# Patient Record
Sex: Male | Born: 1945 | Race: White | Hispanic: No | Marital: Married | Smoking: Never smoker
Health system: Southern US, Community
[De-identification: ages and names within clinical notes are randomized; demographics above are authoritative.]

## PROBLEM LIST (undated history)

## (undated) DIAGNOSIS — L570 Actinic keratosis: Secondary | ICD-10-CM

## (undated) DIAGNOSIS — N400 Enlarged prostate without lower urinary tract symptoms: Secondary | ICD-10-CM

## (undated) DIAGNOSIS — G4730 Sleep apnea, unspecified: Secondary | ICD-10-CM

## (undated) DIAGNOSIS — E785 Hyperlipidemia, unspecified: Secondary | ICD-10-CM

## (undated) DIAGNOSIS — M722 Plantar fascial fibromatosis: Secondary | ICD-10-CM

## (undated) DIAGNOSIS — I10 Essential (primary) hypertension: Secondary | ICD-10-CM

## (undated) DIAGNOSIS — M5459 Other low back pain: Secondary | ICD-10-CM

## (undated) DIAGNOSIS — J309 Allergic rhinitis, unspecified: Secondary | ICD-10-CM

## (undated) DIAGNOSIS — M545 Low back pain: Secondary | ICD-10-CM

## (undated) HISTORY — PX: COLONOSCOPY: SHX174

## (undated) HISTORY — PX: HERNIA REPAIR: SHX51

## (undated) HISTORY — DX: Actinic keratosis: L57.0

---

## 2005-07-31 ENCOUNTER — Ambulatory Visit: Payer: Self-pay | Admitting: Surgery

## 2005-11-10 ENCOUNTER — Ambulatory Visit: Payer: Self-pay | Admitting: Gastroenterology

## 2010-02-01 ENCOUNTER — Ambulatory Visit: Payer: Self-pay | Admitting: Unknown Physician Specialty

## 2011-07-10 ENCOUNTER — Ambulatory Visit: Payer: Self-pay | Admitting: Otolaryngology

## 2011-07-24 ENCOUNTER — Ambulatory Visit: Payer: Self-pay | Admitting: Otolaryngology

## 2015-03-07 ENCOUNTER — Encounter: Payer: Self-pay | Admitting: *Deleted

## 2015-03-08 ENCOUNTER — Ambulatory Visit: Payer: Medicare Other | Admitting: Certified Registered Nurse Anesthetist

## 2015-03-08 ENCOUNTER — Encounter
Admission: RE | Disposition: A | Discharge status: Home / Self Care | Payer: Self-pay | Source: Ambulatory Visit | Attending: Unknown Physician Specialty

## 2015-03-08 ENCOUNTER — Ambulatory Visit
Admission: RE | Admit: 2015-03-08 | Discharge: 2015-03-08 | Disposition: A | Discharge status: Home / Self Care | Payer: Medicare Other | Source: Ambulatory Visit | Attending: Unknown Physician Specialty | Admitting: Unknown Physician Specialty

## 2015-03-08 ENCOUNTER — Encounter: Payer: Self-pay | Admitting: *Deleted

## 2015-03-08 DIAGNOSIS — E785 Hyperlipidemia, unspecified: Secondary | ICD-10-CM | POA: Diagnosis not present

## 2015-03-08 DIAGNOSIS — Z1211 Encounter for screening for malignant neoplasm of colon: Secondary | ICD-10-CM | POA: Insufficient documentation

## 2015-03-08 DIAGNOSIS — Z86010 Personal history of colonic polyps: Secondary | ICD-10-CM | POA: Insufficient documentation

## 2015-03-08 DIAGNOSIS — I10 Essential (primary) hypertension: Secondary | ICD-10-CM | POA: Insufficient documentation

## 2015-03-08 DIAGNOSIS — Z79899 Other long term (current) drug therapy: Secondary | ICD-10-CM | POA: Insufficient documentation

## 2015-03-08 DIAGNOSIS — G4733 Obstructive sleep apnea (adult) (pediatric): Secondary | ICD-10-CM | POA: Insufficient documentation

## 2015-03-08 DIAGNOSIS — K640 First degree hemorrhoids: Secondary | ICD-10-CM | POA: Diagnosis not present

## 2015-03-08 DIAGNOSIS — N400 Enlarged prostate without lower urinary tract symptoms: Secondary | ICD-10-CM | POA: Insufficient documentation

## 2015-03-08 DIAGNOSIS — K5730 Diverticulosis of large intestine without perforation or abscess without bleeding: Secondary | ICD-10-CM | POA: Diagnosis not present

## 2015-03-08 DIAGNOSIS — Z7982 Long term (current) use of aspirin: Secondary | ICD-10-CM | POA: Diagnosis not present

## 2015-03-08 HISTORY — DX: Other low back pain: M54.59

## 2015-03-08 HISTORY — DX: Essential (primary) hypertension: I10

## 2015-03-08 HISTORY — DX: Low back pain: M54.5

## 2015-03-08 HISTORY — DX: Benign prostatic hyperplasia without lower urinary tract symptoms: N40.0

## 2015-03-08 HISTORY — DX: Plantar fascial fibromatosis: M72.2

## 2015-03-08 HISTORY — DX: Hyperlipidemia, unspecified: E78.5

## 2015-03-08 HISTORY — PX: COLONOSCOPY WITH PROPOFOL: SHX5780

## 2015-03-08 HISTORY — DX: Sleep apnea, unspecified: G47.30

## 2015-03-08 HISTORY — DX: Allergic rhinitis, unspecified: J30.9

## 2015-03-08 SURGERY — COLONOSCOPY WITH PROPOFOL
Anesthesia: General

## 2015-03-08 MED ORDER — PROPOFOL 10 MG/ML IV BOLUS
INTRAVENOUS | Status: DC | PRN
  Administered 2015-03-08: 20 mg via INTRAVENOUS

## 2015-03-08 MED ORDER — EPHEDRINE SULFATE 50 MG/ML IJ SOLN
INTRAMUSCULAR | Status: DC | PRN
  Administered 2015-03-08: 5 mg via INTRAVENOUS

## 2015-03-08 MED ORDER — SODIUM CHLORIDE 0.9 % IV SOLN
INTRAVENOUS | Status: DC
  Administered 2015-03-08: 10:00:00 via INTRAVENOUS

## 2015-03-08 MED ORDER — SODIUM CHLORIDE 0.9 % IV SOLN: INTRAVENOUS | Status: DC

## 2015-03-08 MED ORDER — LIDOCAINE HCL (CARDIAC) 20 MG/ML IV SOLN
INTRAVENOUS | Status: DC | PRN
  Administered 2015-03-08: 60 mg via INTRAVENOUS

## 2015-03-08 MED ORDER — MIDAZOLAM HCL 2 MG/2ML IJ SOLN
INTRAMUSCULAR | Status: DC | PRN
  Administered 2015-03-08: 1 mg via INTRAVENOUS

## 2015-03-08 MED ORDER — PROPOFOL 500 MG/50ML IV EMUL
INTRAVENOUS | Status: DC | PRN
  Administered 2015-03-08: 120 ug/kg/min via INTRAVENOUS

## 2015-03-08 NOTE — Anesthesia Preprocedure Evaluation (Signed)
Anesthesia Evaluation  Patient identified by MRN, date of birth, ID band Patient awake    Reviewed: Allergy & Precautions, NPO status , Patient's Chart, lab work & pertinent test results  History of Anesthesia Complications Negative for: history of anesthetic complications  Airway Mallampati: III       Dental   Pulmonary neg pulmonary ROS, sleep apnea and Continuous Positive Airway Pressure Ventilation ,           Cardiovascular hypertension, Pt. on medications      Neuro/Psych    GI/Hepatic negative GI ROS, Neg liver ROS, GERD  ,  Endo/Other  negative endocrine ROS  Renal/GU negative Renal ROS     Musculoskeletal   Abdominal   Peds  Hematology negative hematology ROS (+)   Anesthesia Other Findings   Reproductive/Obstetrics                             Anesthesia Physical Anesthesia Plan  ASA: III  Anesthesia Plan: General   Post-op Pain Management:    Induction: Intravenous  Airway Management Planned: Nasal Cannula  Additional Equipment:   Intra-op Plan:   Post-operative Plan:   Informed Consent: I have reviewed the patients History and Physical, chart, labs and discussed the procedure including the risks, benefits and alternatives for the proposed anesthesia with the patient or authorized representative who has indicated his/her understanding and acceptance.     Plan Discussed with:   Anesthesia Plan Comments:         Anesthesia Quick Evaluation

## 2015-03-08 NOTE — Anesthesia Procedure Notes (Signed)
Date/Time: 03/08/2015 10:03 AM Performed by: Johnna Acosta Pre-anesthesia Checklist: Patient identified, Emergency Drugs available, Suction available, Patient being monitored and Timeout performed Patient Re-evaluated:Patient Re-evaluated prior to inductionOxygen Delivery Method: Nasal cannula

## 2015-03-08 NOTE — Anesthesia Postprocedure Evaluation (Signed)
Anesthesia Post Note  Patient: Justin Torres  Procedure(s) Performed: Procedure(s) (LRB): COLONOSCOPY WITH PROPOFOL (N/A)  Patient location during evaluation: Endoscopy Anesthesia Type: General Level of consciousness: awake and alert Pain management: pain level controlled Vital Signs Assessment: post-procedure vital signs reviewed and stable Respiratory status: spontaneous breathing and respiratory function stable Cardiovascular status: stable Anesthetic complications: no    Last Vitals:  Filed Vitals:   03/08/15 1053 03/08/15 1103  BP: 108/68 113/73  Pulse: 55 51  Temp:    Resp: 16 14    Last Pain: There were no vitals filed for this visit.               Hesper Venturella K

## 2015-03-08 NOTE — Op Note (Signed)
[facility] Gastroenterology Patient Name: Justin Torres Procedure Date: 03/08/2015 10:00 AM MRN: [id number] Account #: [id number] Date of Birth: 19-Jun-1945 Admit Type: Outpatient Age: 70 Room: [facility] ENDO ROOM 1 Gender: Male Note Status: Finalized Procedure:         Colonoscopy Indications:       High risk colon cancer surveillance: Personal history of                     colonic polyps Providers:         Manya Silvas, MD Referring MD:      Leonie Douglas. Doy Hutching, MD (Referring MD) Medicines:         Propofol per Anesthesia Complications:     No immediate complications. Procedure:         Pre-Anesthesia Assessment:                    - After reviewing the risks and benefits, the patient was                     deemed in satisfactory condition to undergo the procedure.                    After obtaining informed consent, the colonoscope was                     passed under direct vision. Throughout the procedure, the                     patient's blood pressure, pulse, and oxygen saturations                     were monitored continuously. The Colonoscope was                     introduced through the anus and advanced to the the cecum,                     identified by appendiceal orifice and ileocecal valve. The                     colonoscopy was performed without difficulty. The patient                     tolerated the procedure well. The quality of the bowel                     preparation was good. Findings:      Multiple small-mouthed diverticula were found in the sigmoid colon, in       the descending colon and in the transverse colon.      Internal hemorrhoids were found during endoscopy. The hemorrhoids were       small and Grade I (internal hemorrhoids that do not prolapse).      The exam was otherwise without abnormality. Impression:        - Diverticulosis in the sigmoid colon, in the descending                     colon and in the transverse  colon.                    - Internal hemorrhoids.                    -  The examination was otherwise normal.                    - No specimens collected. Recommendation:    - Repeat colonoscopy in 5 years for surveillance. Diagnosis Code(s): --- Professional ---                    Z86.010, Personal history of colonic polyps                    K64.0, First degree hemorrhoids                    K57.30, Diverticulosis of large intestine without                     perforation or abscess without bleeding Manya Silvas, MD 03/08/2015 10:22:42 AM This report has been signed electronically. Number of Addenda: 0 Note Initiated On: 03/08/2015 10:00 AM Scope Withdrawal Time: 0 hours 6 minutes 45 seconds  Total Procedure Duration: 0 hours 14 minutes 22 seconds       [facility]

## 2015-03-08 NOTE — H&P (Signed)
   Primary Care Physician:  Idelle Crouch, MD Primary Gastroenterologist:  Dr. Vira Agar  Pre-Procedure History & Physical: HPI:  Justin Torres is a 70 y.o. male is here for an colonoscopy.   Past Medical History  Diagnosis Date  . Allergic rhinitis   . Benign hypertension   . BPH (benign prostatic hypertrophy)   . Hyperlipidemia   . Mechanical low back pain   . Sleep apnea     Obstructive; Uses CPAP  . Plantar fasciitis     h/o heel spur    Past Surgical History  Procedure Laterality Date  . Hernia repair      left inguinal  . Colonoscopy  11/10/2005; 02/01/2010    With adenomatous polyp    Prior to Admission medications   Medication Sig Start Date End Date Taking? Authorizing Provider  aspirin EC 81 MG tablet Take 81 mg by mouth daily.   Yes Historical Provider, MD  doxazosin (CARDURA) 2 MG tablet Take 2 mg by mouth daily.   Yes Historical Provider, MD  primidone (MYSOLINE) 50 MG tablet Take 50 mg by mouth 2 (two) times daily.   Yes Historical Provider, MD    Allergies as of 02/06/2015  . (Not on File)    History reviewed. No pertinent family history.  Social History   Social History  . Marital Status: Married    Spouse Name: N/A  . Number of Children: N/A  . Years of Education: N/A   Occupational History  . Not on file.   Social History Main Topics  . Smoking status: Never Smoker   . Smokeless tobacco: Never Used  . Alcohol Use: No  . Drug Use: No  . Sexual Activity: Not on file   Other Topics Concern  . Not on file   Social History Narrative    Review of Systems: See HPI, otherwise negative ROS  Physical Exam: BP 120/56 mmHg  Pulse 51  Temp(Src) 97.2 F (36.2 C) (Tympanic)  Resp 14  Ht 5\' 10"  (1.778 m)  Wt 88.451 kg (195 lb)  BMI 27.98 kg/m2  SpO2 97% General:   Alert,  pleasant and cooperative in NAD Head:  Normocephalic and atraumatic. Neck:  Supple; no masses or thyromegaly. Lungs:  Clear throughout to auscultation.    Heart:   Regular rate and rhythm. Abdomen:  Soft, nontender and nondistended. Normal bowel sounds, without guarding, and without rebound.   Neurologic:  Alert and  oriented x4;  grossly normal neurologically.  Impression/Plan: Felipa Furnace is here for an colonoscopy to be performed for [facility] colon polyps  Risks, benefits, limitations, and alternatives regarding  colonoscopy have been reviewed with the patient.  Questions have been answered.  All parties agreeable.   Gaylyn Cheers, MD  03/08/2015, 9:55 AM

## 2015-03-08 NOTE — Transfer of Care (Signed)
Immediate Anesthesia Transfer of Care Note  Patient: Justin Torres  Procedure(s) Performed: Procedure(s): COLONOSCOPY WITH PROPOFOL (N/A)  Patient Location: PACU  Anesthesia Type:General  Level of Consciousness: sedated  Airway & Oxygen Therapy: Patient Spontanous Breathing and Patient connected to nasal cannula oxygen  Post-op Assessment: Report given to RN and Post -op Vital signs reviewed and stable  Post vital signs: Reviewed and stable  Last Vitals:  Filed Vitals:   03/08/15 0922 03/08/15 1023  BP: 120/56 94/51  Pulse: 51 51  Temp: 36.2 C 36 C  Resp: 14 12    Complications: No apparent anesthesia complications

## 2015-03-12 ENCOUNTER — Encounter: Payer: Self-pay | Admitting: Unknown Physician Specialty

## 2016-02-25 ENCOUNTER — Ambulatory Visit (INDEPENDENT_AMBULATORY_CARE_PROVIDER_SITE_OTHER): Payer: Medicare Other | Admitting: Urology

## 2016-02-25 ENCOUNTER — Encounter: Payer: Self-pay | Admitting: Urology

## 2016-02-25 VITALS — BP 157/77 | HR 56 | Ht 70.0 in | Wt 194.1 lb

## 2016-02-25 DIAGNOSIS — R972 Elevated prostate specific antigen [PSA]: Secondary | ICD-10-CM

## 2016-02-25 DIAGNOSIS — R3912 Poor urinary stream: Secondary | ICD-10-CM

## 2016-02-25 MED ORDER — SULFAMETHOXAZOLE-TRIMETHOPRIM 400-80 MG PO TABS: 1.0000 | ORAL_TABLET | Freq: Two times a day (BID) | ORAL | 0 refills | Status: DC

## 2016-02-25 NOTE — Progress Notes (Signed)
02/25/2016 9:38 AM   Justin Torres Peter Garter [birth year] [id number]  Referring provider: Idelle Crouch, MD [facility] [place], [place] [address]  CC: new patient with elevated / rising PSA  HPI: 1. Elevated / Rising PSA - No FHX prostate cancer.  2016 - PSA 3.3 12/2015 PSA 4.44 ===> recheck 01/2016 4.48 / DRE >80gm smooth.   2. Weak urinary stream - on doxazosin at baseline for dual indication of obstructive urinary symptoms and HTN with excellent control. DRE 2018 >80gm.   PMH sig for OSA/CPAP, LIH, HTN. NO ischemic CV disease / blood thinners. 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 Marland Kitchen His PCP is Georgie Chard MD with [facility].    PMH: Past Medical History:  Diagnosis Date  . Allergic rhinitis   . Benign hypertension   . BPH (benign prostatic hypertrophy)   . Hyperlipidemia   . Mechanical low back pain   . Plantar fasciitis    h/o heel spur  . Sleep apnea    Obstructive; Uses CPAP    Surgical History: Past Surgical History:  Procedure Laterality Date  . COLONOSCOPY  11/10/2005; 02/01/2010   With adenomatous polyp  . COLONOSCOPY WITH PROPOFOL N/A 03/08/2015   Procedure: COLONOSCOPY WITH PROPOFOL;  Surgeon: Manya Silvas, MD;  Location: [facility] ENDOSCOPY;  Service: Endoscopy;  Laterality: N/A;  . HERNIA REPAIR     left inguinal    Home Medications:  Allergies as of 02/25/2016      Reactions   Darvon [propoxyphene] Nausea And Vomiting      Medication List       Accurate as of 02/25/16  9:38 AM. Always use your most recent med list.          aspirin EC 81 MG tablet Take 81 mg by mouth daily.   doxazosin 2 MG tablet Commonly known as:  CARDURA Take 2 mg by mouth daily.   primidone 50 MG tablet Commonly known as:  MYSOLINE Take 50 mg by mouth 2 (two) times daily.       Allergies:  Allergies  Allergen Reactions  . Darvon [Propoxyphene] Nausea And Vomiting    Family History: No family history on  file.  Social History:  reports that he has never smoked. He has never used smokeless tobacco. He reports that he does not drink alcohol or use drugs.     Review of Systems  Gastrointestinal (upper)  : Negative for upper GI symptoms  Gastrointestinal (lower) : Negative for lower GI symptoms  Constitutional : Negative for symptoms  Skin: Negative for skin symptoms  Eyes: Negative for eye symptoms  Ear/Nose/Throat : Negative for Ear/Nose/Throat symptoms  Hematologic/Lymphatic: Negative for Hematologic/Lymphatic symptoms  Cardiovascular : Negative for cardiovascular symptoms  Respiratory : Negative for respiratory symptoms  Endocrine: Negative for endocrine symptoms  Musculoskeletal: Negative for musculoskeletal symptoms  Neurological: Negative for neurological symptoms  Psychologic: Negative for psychiatric symptoms  Physical Exam: There were no vitals taken for this visit.  Constitutional:  Alert and oriented, No acute distress. HEENT: [place] AT, moist mucus membranes.  Trachea midline, no masses. Cardiovascular: No clubbing, cyanosis, or edema. Respiratory: Normal respiratory effort, no increased work of breathing. GI: Abdomen is soft, nontender, nondistended, no abdominal masses GU: No CVA tenderness. No scrotal masses. DRE 80gm+ smooth.  Skin: No rashes, bruises or suspicious lesions. Lymph: No cervical or inguinal adenopathy. Neurologic: Grossly intact, no focal deficits, moving all 4 extremities. Psychiatric: Normal mood and affect.  Laboratory Data: No results found for: WBC, HGB, HCT, MCV, PLT  No results found for: CREATININE  No results found for: PSA  No results found for: TESTOSTERONE  No results found for: HGBA1C  Urinalysis No results found for: COLORURINE, APPEARANCEUR, LABSPEC, PHURINE, GLUCOSEU, HGBUR, BILIRUBINUR, KETONESUR, PROTEINUR, UROBILINOGEN, NITRITE, LEUKOCYTESUR  Pertinent Imaging: none  Assessment & Plan:   1.  Elevated / Rising PSA - significant rist and persistant elevation in man with minimal comorbidity. Natural histry or prostate cancer discussed as well as furhter management options with serial labs, genetic testing (not well covered by insurance and cannot DX cancer if present), or biopsy (most definitive, only test that can DX cancer).  Biopsy recommended given overall picture.  He opts for BX and I agree given his excellent health. Hopefully this is all just BPH.   Risks, benefits, expected peri-BX course discussed. Bactirm peri-BX RX'd today.   2. Weak urinary stream - continue doxazosin pending further data, consider addiiton of finasteride if BX proves benign or very low risk disease.    Alexis Frock, [facility] [address], [place],  [address] [phone]

## 2016-02-25 NOTE — Addendum Note (Signed)
Addended by: Wilson Singer on: 02/25/2016 03:47 PM   Modules accepted: Orders

## 2016-03-18 ENCOUNTER — Other Ambulatory Visit: Payer: Self-pay | Admitting: Urology

## 2016-03-18 ENCOUNTER — Ambulatory Visit (INDEPENDENT_AMBULATORY_CARE_PROVIDER_SITE_OTHER): Payer: Medicare Other | Admitting: Urology

## 2016-03-18 DIAGNOSIS — R972 Elevated prostate specific antigen [PSA]: Secondary | ICD-10-CM | POA: Diagnosis not present

## 2016-03-18 MED ORDER — LIDOCAINE HCL 2 % EX GEL
1.0000 "application " | Freq: Once | CUTANEOUS | Status: AC
  Administered 2016-03-18: 1

## 2016-03-18 MED ORDER — GENTAMICIN SULFATE 40 MG/ML IJ SOLN
80.0000 mg | Freq: Once | INTRAMUSCULAR | Status: AC
Start: 2016-03-18 — End: 2016-03-18
  Administered 2016-03-18: 80 mg via INTRAMUSCULAR

## 2016-03-18 MED ORDER — LEVOFLOXACIN 500 MG PO TABS
500.0000 mg | ORAL_TABLET | Freq: Once | ORAL | Status: AC
  Administered 2016-03-18: 500 mg via ORAL

## 2016-03-18 NOTE — Progress Notes (Signed)
Prostate Biopsy Procedure   Informed consent was obtained after discussing risks/benefits of the procedure.  A time out was performed to ensure correct patient identity.  Pre-Procedure: - Last PSA Level: No results found for: PSA - Gentamicin given prophylactically - Levaquin 500 mg administered PO -Transrectal Ultrasound performed revealing a 80 gm prostate -No significant hypoechoic or median lobe noted  Procedure: - Prostate block performed using 10 cc 1% lidocaine and biopsies taken from sextant areas, a total of 12 under ultrasound guidance.  Post-Procedure: - Patient tolerated the procedure well - He was counseled to seek immediate medical attention if experiences any severe pain, significant bleeding, or fevers - Return in one week to discuss biopsy results

## 2016-03-24 ENCOUNTER — Ambulatory Visit (INDEPENDENT_AMBULATORY_CARE_PROVIDER_SITE_OTHER): Payer: Medicare Other | Admitting: Urology

## 2016-03-24 ENCOUNTER — Encounter: Payer: Self-pay | Admitting: Urology

## 2016-03-24 ENCOUNTER — Other Ambulatory Visit: Payer: Self-pay | Admitting: Urology

## 2016-03-24 VITALS — BP 148/77 | HR 54 | Ht 70.0 in | Wt 188.3 lb

## 2016-03-24 DIAGNOSIS — R972 Elevated prostate specific antigen [PSA]: Secondary | ICD-10-CM

## 2016-03-24 LAB — PATHOLOGY REPORT

## 2016-03-24 NOTE — Progress Notes (Signed)
03/24/2016 5:29 PM   Justin Torres Justin Torres [birth year] [id number]  Referring provider: Idelle Crouch, MD [facility] [place], [place] [address]  CC: new patient with elevated / rising PSA  HPI: 1. Elevated / Rising PSA - No FHX prostate cancer.  2016 - PSA 3.3 12/2015 PSA 4.44 ===> recheck 01/2016 4.48 / DRE >80gm smooth.   2. Weak urinary stream - on doxazosin at baseline for dual indication of obstructive urinary symptoms and HTN with excellent control. DRE 2018 >80gm.   PMH sig for OSA/CPAP, LIH, HTN. NO ischemic CV disease / blood thinners. 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 Marland Kitchen His PCP is Georgie Chard MD with [facility].   Interval: The patient is now status post prostate biopsy and presents today for discussion of the results. His biopsy returned as BPH with chronic inflammation. His TRUS volume was measured at 80 g.  The patient states that he has baseline voiding symptoms which is been present for quite a while. He has urinary urgency and occasional urge incontinence with mild leakage. He also has nocturia times 2 or 3. He describes a weak stream intermittently, and feels as if he empties his bladder completely most of the time.  PMH: Past Medical History:  Diagnosis Date  . Allergic rhinitis   . Benign hypertension   . BPH (benign prostatic hypertrophy)   . Hyperlipidemia   . Mechanical low back pain   . Plantar fasciitis    h/o heel spur  . Sleep apnea    Obstructive; Uses CPAP    Surgical History: Past Surgical History:  Procedure Laterality Date  . COLONOSCOPY  11/10/2005; 02/01/2010   With adenomatous polyp  . COLONOSCOPY WITH PROPOFOL N/A 03/08/2015   Procedure: COLONOSCOPY WITH PROPOFOL;  Surgeon: Manya Silvas, MD;  Location: [facility] ENDOSCOPY;  Service: Endoscopy;  Laterality: N/A;  . HERNIA REPAIR     left inguinal    Home Medications:  Allergies as of 03/24/2016      Reactions   Darvon [propoxyphene] Nausea  And Vomiting, Nausea Only   Other reaction(s): Vomiting      Medication List       Accurate as of 03/24/16  5:29 PM. Always use your most recent med list.          aspirin EC 81 MG tablet Take 81 mg by mouth daily.   doxazosin 2 MG tablet Commonly known as:  CARDURA Take 2 mg by mouth daily.   GLUCOSAMINE COMPLEX PO Take by mouth.   primidone 50 MG tablet Commonly known as:  MYSOLINE Take 50 mg by mouth 2 (two) times daily.   sulfamethoxazole-trimethoprim 400-80 MG tablet Commonly known as:  BACTRIM Take 1 tablet by mouth 2 (two) times daily. Begin 3 days before prostate biopsy.       Allergies:  Allergies  Allergen Reactions  . Darvon [Propoxyphene] Nausea And Vomiting and Nausea Only    Other reaction(s): Vomiting    Family History: Family History  Problem Relation Age of Onset  . Prostate cancer Neg Hx   . Renal cancer Neg Hx     Social History:  reports that he has never smoked. He has never used smokeless tobacco. He reports that he does not drink alcohol or use drugs.     Review of Systems  Gastrointestinal (upper)  : Negative for upper GI symptoms  Gastrointestinal (lower) : Negative for lower GI symptoms  Constitutional : Negative for symptoms  Skin: Negative for skin symptoms  Eyes: Negative for eye symptoms  Ear/Nose/Throat : Negative for Ear/Nose/Throat symptoms  Hematologic/Lymphatic: Negative for Hematologic/Lymphatic symptoms  Cardiovascular : Negative for cardiovascular symptoms  Respiratory : Negative for respiratory symptoms  Endocrine: Negative for endocrine symptoms  Musculoskeletal: Negative for musculoskeletal symptoms  Neurological: Negative for neurological symptoms  Psychologic: Negative for psychiatric symptoms  Physical Exam: BP (!) 148/77   Pulse (!) 54   Ht 5\' 10"  (1.778 m)   Wt 85.4 kg (188 lb 4.8 oz)   BMI 27.02 kg/m   Constitutional:  Alert and oriented, No acute distress. Neurologic:  Grossly intact, no focal deficits, moving all 4 extremities. Psychiatric: Normal mood and affect.  Laboratory Data: No results found for: WBC, HGB, HCT, MCV, PLT  No results found for: CREATININE  No results found for: PSA  No results found for: TESTOSTERONE  No results found for: HGBA1C  Urinalysis No results found for: COLORURINE, APPEARANCEUR, LABSPEC, PHURINE, GLUCOSEU, HGBUR, BILIRUBINUR, KETONESUR, PROTEINUR, UROBILINOGEN, NITRITE, LEUKOCYTESUR  Pertinent Imaging: none  Assessment & Plan:   1. Elevated / Rising PSA -fortunately, the patient's biopsy returned as chronic inflammation with benign prostatic hyperplasia. Our plan at this point is to have the patient continue to monitor his PSA with repeat PSA in 6 months. I encouraged the patient to continue to drink a lot of water, I suspect that the prosthetic bleeding will subside over the next couple days.  2. Weak urinary stream - continue doxazosin. We did discuss starting the patient on finasteride or a different agent, but the patient's overall satisfaction with the symptoms is relatively good, and the patient is reluctant to start additional medication. However, we will continue this discussion in our follow-up visit 6 months from today with a PSA prior. At that time, we will obtain an IPSS.    Ardis Hughs, [facility] [address], [place],  [address] [phone]

## 2016-03-27 ENCOUNTER — Telehealth: Payer: Self-pay

## 2016-03-27 DIAGNOSIS — R972 Elevated prostate specific antigen [PSA]: Secondary | ICD-10-CM

## 2016-03-27 NOTE — Telephone Encounter (Signed)
Justin Hughs, MD  Lestine Box, LPN        Give call patient with results of his prostate biopsy and let him know that he should follow-up in 6 months with a PSA prior.  Thanks  ben    Spoke with pt in reference to prostate bx and needing a f/u. Pt stated he already has appts on file and voiced understanding.

## 2016-10-07 ENCOUNTER — Other Ambulatory Visit: Payer: Medicare Other

## 2016-10-07 DIAGNOSIS — R972 Elevated prostate specific antigen [PSA]: Secondary | ICD-10-CM

## 2016-10-08 LAB — PSA: PROSTATE SPECIFIC AG, SERUM: 4.1 ng/mL — AB (ref 0.0–4.0)

## 2016-10-14 ENCOUNTER — Encounter: Payer: Self-pay | Admitting: Urology

## 2016-10-14 ENCOUNTER — Telehealth: Payer: Self-pay

## 2016-10-14 ENCOUNTER — Ambulatory Visit (INDEPENDENT_AMBULATORY_CARE_PROVIDER_SITE_OTHER): Payer: Medicare Other | Admitting: Urology

## 2016-10-14 VITALS — BP 127/62 | HR 58 | Ht 70.0 in | Wt 194.0 lb

## 2016-10-14 DIAGNOSIS — R972 Elevated prostate specific antigen [PSA]: Secondary | ICD-10-CM | POA: Diagnosis not present

## 2016-10-14 NOTE — Telephone Encounter (Signed)
Justin Hughs, MD  Toniann Fail C, LPN        Please let patient know that his PSA is stable from last check.  Thank you,  BH    Pt has an appt today with  Dr. Louis Meckel.

## 2016-10-14 NOTE — Progress Notes (Signed)
10/14/2016 4:10 PM   Janine Ores Peter Garter Dec 23, 1945 [id number]  Referring provider: Idelle Crouch, MD [place], [place] [address]  CC: new patient with elevated / rising PSA  HPI: 1. Elevated / Rising PSA - No FHX prostate cancer.  2016 - PSA 3.3 12/2015 PSA 4.44 ===> recheck 01/2016 4.48 / DRE >80gm smooth.  10/07/16 PSA - 4.1   BPH: on doxazosin at baseline for dual indication of obstructive urinary symptoms and HTN with excellent control.  The patient states that he has baseline voiding symptoms which is been present for quite a while. He has urinary urgency and occasional urge incontinence with mild leakage. He also has nocturia times 2 or 3. He describes a weak stream intermittently, and feels as if he empties his bladder completely most of the time.  PMH sig for OSA/CPAP, LIH, HTN. NO ischemic CV disease / blood thinners. 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  His PCP is Georgie Chard MD with [facility].   Interval: The patient presents today for follow-up for an elevated PSA. He states that since his last biopsy he is had some fecal incontinence. This seems to be improving over the past several weeks, but he does state that he leaks quite a bit and as a result has some chafing and raw surfaces around his anus.  He denies any significant progression of his urinary tract symptoms.  PMH: Past Medical History:  Diagnosis Date  . Allergic rhinitis   . Benign hypertension   . BPH (benign prostatic hypertrophy)   . Hyperlipidemia   . Mechanical low back pain   . Plantar fasciitis    h/o heel spur  . Sleep apnea    Obstructive; Uses CPAP    Surgical History: Past Surgical History:  Procedure Laterality Date  . COLONOSCOPY  11/10/2005; 02/01/2010   With adenomatous polyp  . COLONOSCOPY WITH PROPOFOL N/A 03/08/2015   Procedure: COLONOSCOPY WITH PROPOFOL;  Surgeon: Manya Silvas, MD;  Location: [facility] ENDOSCOPY;   Service: Endoscopy;  Laterality: N/A;  . HERNIA REPAIR     left inguinal    Home Medications:  Allergies as of 10/14/2016      Reactions   Darvon [propoxyphene] Nausea And Vomiting, Nausea Only   Other reaction(s): Vomiting      Medication List       Accurate as of 10/14/16  4:10 PM. Always use your most recent med list.          aspirin EC 81 MG tablet Take 81 mg by mouth daily.   doxazosin 2 MG tablet Commonly known as:  CARDURA Take 2 mg by mouth daily.   GLUCOSAMINE COMPLEX PO Take by mouth.   primidone 50 MG tablet Commonly known as:  MYSOLINE Take 50 mg by mouth 2 (two) times daily.       Allergies:  Allergies  Allergen Reactions  . Darvon [Propoxyphene] Nausea And Vomiting and Nausea Only    Other reaction(s): Vomiting    Family History: Family History  Problem Relation Age of Onset  . Prostate cancer Neg Hx   . Renal cancer Neg Hx     Social History:  reports that he has never smoked. He has never used smokeless tobacco. He reports that he does not drink alcohol or use drugs. Psychologic Depression?: No Anxiety?: No   Review of Systems  Gastrointestinal (upper)  : Negative for upper GI symptoms  Gastrointestinal (lower) : Negative for  lower GI symptoms  Constitutional : Negative for symptoms  Skin: Negative for skin symptoms  Eyes: Negative for eye symptoms  Ear/Nose/Throat : Negative for Ear/Nose/Throat symptoms  Hematologic/Lymphatic: Negative for Hematologic/Lymphatic symptoms  Cardiovascular : Negative for cardiovascular symptoms  Respiratory : Negative for respiratory symptoms  Endocrine: Negative for endocrine symptoms  Musculoskeletal: Negative for musculoskeletal symptoms  Neurological: Negative for neurological symptoms  Psychologic: Negative for psychiatric symptoms  Physical Exam: BP 127/62 (BP Location: Left Arm, Patient Position: Sitting, Cuff Size: Normal)   Pulse (!) 58   Ht 5\' 10"  (1.778 m)    Wt 88 kg (194 lb)   BMI 27.84 kg/m   Constitutional:  Alert and oriented, No acute distress. Neurologic: Grossly intact, no focal deficits, moving all 4 extremities. Psychiatric: Normal mood and affect.  Laboratory Data: No results found for: WBC, HGB, HCT, MCV, PLT  No results found for: CREATININE  No results found for: PSA  No results found for: TESTOSTERONE  No results found for: HGBA1C  Urinalysis No results found for: COLORURINE, APPEARANCEUR, LABSPEC, PHURINE, GLUCOSEU, HGBUR, BILIRUBINUR, KETONESUR, PROTEINUR, UROBILINOGEN, NITRITE, LEUKOCYTESUR  Pertinent Imaging: none  Assessment & Plan:   1. Elevated / Rising PSA - the patient has a stable PSA today, 4.1, down from 4.4. He had a previously negative prostate biopsy with an 80 g prostate. His urinary tract symptoms are stable on doxazosin. I plan is to have the patient follow up again in 6 months, hopefully at that point his PSA will continue to trend down and he can follow up with [place] annually.  As relates to the patient's fecal incontinence, correlating this to the prostate biopsy, I suspect this will improve eyes it has over the past several weeks. If it is not improved completely R Duke Salvia in follow-up again in 3 months, otherwise I would like see him back in 6 months with a PSA prior.   Ardis Hughs, [facility] [address], [place], [place] [address] [phone]

## 2017-04-14 ENCOUNTER — Other Ambulatory Visit: Payer: Medicare Other

## 2017-04-14 DIAGNOSIS — R972 Elevated prostate specific antigen [PSA]: Secondary | ICD-10-CM

## 2017-04-15 LAB — PSA: PROSTATE SPECIFIC AG, SERUM: 4.3 ng/mL — AB (ref 0.0–4.0)

## 2017-04-16 ENCOUNTER — Ambulatory Visit (INDEPENDENT_AMBULATORY_CARE_PROVIDER_SITE_OTHER): Payer: Medicare Other | Admitting: Urology

## 2017-04-16 VITALS — BP 121/70 | HR 107 | Ht 70.0 in | Wt 190.0 lb

## 2017-04-16 DIAGNOSIS — R972 Elevated prostate specific antigen [PSA]: Secondary | ICD-10-CM

## 2017-04-16 NOTE — Progress Notes (Signed)
04/16/2017 2:29 PM   Janine Ores Peter Garter August 16, 1945 [id number]  Referring provider: Idelle Crouch, MD [facility] [place], [place] [address]  No chief complaint on file.   HPI: 1. Elevated / Rising PSA - No FHX prostate cancer.  2016 - PSA 3.3 12/2015 PSA 4.44 ===> recheck 01/2016 4.48 / DRE >80gm smooth.  03/18/16 Negative prostate biopsy 10/07/16 PSA - 4.1 04/14/17 PSA 4.3   BPH: on doxazosin at baseline for dual indication of obstructive urinary symptoms and HTN with excellent control.       PMH: Past Medical History:  Diagnosis Date  . Allergic rhinitis   . Benign hypertension   . BPH (benign prostatic hypertrophy)   . Hyperlipidemia   . Mechanical low back pain   . Plantar fasciitis    h/o heel spur  . Sleep apnea    Obstructive; Uses CPAP    Surgical History: Past Surgical History:  Procedure Laterality Date  . COLONOSCOPY  11/10/2005; 02/01/2010   With adenomatous polyp  . COLONOSCOPY WITH PROPOFOL N/A 03/08/2015   Procedure: COLONOSCOPY WITH PROPOFOL;  Surgeon: Manya Silvas, MD;  Location: [facility] ENDOSCOPY;  Service: Endoscopy;  Laterality: N/A;  . HERNIA REPAIR     left inguinal    Home Medications:  Allergies as of 04/16/2017      Reactions   Darvon [propoxyphene] Nausea And Vomiting, Nausea Only   Other reaction(s): Vomiting      Medication List        Accurate as of 04/16/17  2:29 PM. Always use your most recent med list.          aspirin EC 81 MG tablet Take 81 mg by mouth daily.   doxazosin 2 MG tablet Commonly known as:  CARDURA Take 2 mg by mouth daily.   GLUCOSAMINE COMPLEX PO Take by mouth.   primidone 50 MG tablet Commonly known as:  MYSOLINE Take 50 mg by mouth 2 (two) times daily.       Allergies:  Allergies  Allergen Reactions  . Darvon [Propoxyphene] Nausea And Vomiting and Nausea Only    Other reaction(s): Vomiting    Family History: Family History  Problem Relation Age of  Onset  . Prostate cancer Neg Hx   . Renal cancer Neg Hx     Social History:  reports that  has never smoked. he has never used smokeless tobacco. He reports that he does not drink alcohol or use drugs.  ROS:                                        Physical Exam: There were no vitals taken for this visit.  Constitutional:  Alert and oriented, No acute distress. HEENT: [place] AT, moist mucus membranes.  Trachea midline, no masses. Cardiovascular: No clubbing, cyanosis, or edema. Respiratory: Normal respiratory effort, no increased work of breathing. GI: Abdomen is soft, nontender, nondistended, no abdominal masses GU: No CVA tenderness.  Skin: No rashes, bruises or suspicious lesions. Lymph: No cervical or inguinal adenopathy. Neurologic: Grossly intact, no focal deficits, moving all 4 extremities. Psychiatric: Normal mood and affect.  Laboratory Data: No results found for: WBC, HGB, HCT, MCV, PLT  No results found for: CREATININE  No results found for: PSA  No results found for: TESTOSTERONE  No results found for: HGBA1C  Urinalysis No results found for: COLORURINE, APPEARANCEUR, [place], [place], [place],  HGBUR, BILIRUBINUR, KETONESUR, PROTEINUR, UROBILINOGEN, NITRITE, LEUKOCYTESUR  Assessment & Plan:    1. Elevated PSA PSA stable. Repeat PSA/DRE in 6 months  No Follow-up on file.  Nickie Retort, MD  [facility] [address], [place], [place] [address] [phone]

## 2017-10-13 ENCOUNTER — Other Ambulatory Visit: Payer: Medicare Other

## 2017-10-13 DIAGNOSIS — R972 Elevated prostate specific antigen [PSA]: Secondary | ICD-10-CM

## 2017-10-14 LAB — PSA: Prostate Specific Ag, Serum: 4.3 ng/mL — ABNORMAL HIGH (ref 0.0–4.0)

## 2017-10-16 ENCOUNTER — Ambulatory Visit: Payer: Medicare Other

## 2017-10-22 ENCOUNTER — Ambulatory Visit (INDEPENDENT_AMBULATORY_CARE_PROVIDER_SITE_OTHER): Payer: Medicare Other | Admitting: Urology

## 2017-10-22 ENCOUNTER — Encounter: Payer: Self-pay | Admitting: Urology

## 2017-10-22 VITALS — BP 138/81 | HR 56 | Ht 70.0 in | Wt 199.2 lb

## 2017-10-22 DIAGNOSIS — N401 Enlarged prostate with lower urinary tract symptoms: Secondary | ICD-10-CM

## 2017-10-22 NOTE — Progress Notes (Signed)
   10/22/2017 1:56 PM   Pavle Carlean Jews Peter Garter 02-Aug-1945 [id number]  Reason for visit: Follow up PSA surveillance, LUTS  HPI: I saw Mr. Alderman in urology clinic today for PSA surveillance.  He is a 72 year old male whose PSA was mildly elevated to 4.5 in December 2017 and underwent prostate biopsy.  Ultrasound showed an 80 g gland, and biopsy was benign.  His PSA has remained stable since that time.  PSA today is 4.3 from 4.3 six months prior.  He has mild urinary symptoms including frequency and mild urgency that are well managed with doxazosin.  He drinks coffee every morning and Diet Coke frequently.   ROS: Please see flowsheet from today's date for complete review of systems.  Physical Exam: BP 138/81 (BP Location: Left Arm, Patient Position: Sitting, Cuff Size: Normal)   Pulse (!) 56   Ht 5\' 10"  (1.778 m)   Wt 199 lb 3.2 oz (90.4 kg)   BMI 28.58 kg/m    Constitutional:  Alert and oriented, No acute distress. Respiratory: Normal respiratory effort, no increased work of breathing. GI: Abdomen is soft, nontender, nondistended, no abdominal masses GU: No CVA tenderness Skin: No rashes, bruises or suspicious lesions. Neurologic: Grossly intact, no focal deficits, moving all 4 extremities. Psychiatric: Normal mood and affect  Laboratory Data: PSA 4.3(4.3)   Assessment & Plan:   In summary, Mr. Menning is a 72 year old male we have been following for mild lower urinary tract symptoms well controlled on doxazosin as well as for PSA surveillance.  He has an 80 g prostate and negative prostate biopsy in 2017, and his PSA has been stable at 4.3 for 2+ years.  Per AUA guidelines will discontinue PSA screening, patient in agreement.  We had a discussion about strategies to minimize his urgency and frequency including minimizing caffeine and diet Coke, timed voiding, and double voiding.  He is not interested in additional medications at this time, as his urinary symptoms are minimally  bothersome.  RTC 1 year with PVR/IPSS   Return in about 1 year (around 10/23/2018).  Billey Co, [facility] [address], [place], [place] [address] [phone]

## 2017-11-17 ENCOUNTER — Other Ambulatory Visit: Payer: Self-pay | Admitting: Physician Assistant

## 2017-11-17 DIAGNOSIS — R3129 Other microscopic hematuria: Secondary | ICD-10-CM

## 2017-11-20 ENCOUNTER — Ambulatory Visit
Admission: RE | Admit: 2017-11-20 | Discharge: 2017-11-20 | Disposition: A | Discharge status: Home / Self Care | Payer: Medicare Other | Source: Ambulatory Visit | Attending: Physician Assistant | Admitting: Physician Assistant

## 2017-11-20 DIAGNOSIS — N400 Enlarged prostate without lower urinary tract symptoms: Secondary | ICD-10-CM | POA: Diagnosis not present

## 2017-11-20 DIAGNOSIS — R3129 Other microscopic hematuria: Secondary | ICD-10-CM | POA: Diagnosis not present

## 2017-11-20 DIAGNOSIS — K8020 Calculus of gallbladder without cholecystitis without obstruction: Secondary | ICD-10-CM | POA: Diagnosis not present

## 2017-11-20 DIAGNOSIS — N281 Cyst of kidney, acquired: Secondary | ICD-10-CM | POA: Diagnosis not present

## 2017-11-20 DIAGNOSIS — K5731 Diverticulosis of large intestine without perforation or abscess with bleeding: Secondary | ICD-10-CM | POA: Insufficient documentation

## 2018-03-14 ENCOUNTER — Encounter: Payer: Self-pay | Admitting: Emergency Medicine

## 2018-03-14 ENCOUNTER — Inpatient Hospital Stay: Payer: Medicare Other | Admitting: Anesthesiology

## 2018-03-14 ENCOUNTER — Emergency Department: Payer: Medicare Other

## 2018-03-14 ENCOUNTER — Other Ambulatory Visit: Payer: Self-pay

## 2018-03-14 ENCOUNTER — Inpatient Hospital Stay
Admission: EM | Admit: 2018-03-14 | Discharge: 2018-03-15 | DRG: 419 | Disposition: A | Discharge status: Home / Self Care | Payer: Medicare Other | Attending: General Surgery | Admitting: General Surgery

## 2018-03-14 ENCOUNTER — Encounter
Admission: EM | Disposition: A | Discharge status: Home / Self Care | Payer: Self-pay | Source: Home / Self Care | Attending: General Surgery

## 2018-03-14 DIAGNOSIS — N400 Enlarged prostate without lower urinary tract symptoms: Secondary | ICD-10-CM | POA: Diagnosis present

## 2018-03-14 DIAGNOSIS — Z79899 Other long term (current) drug therapy: Secondary | ICD-10-CM | POA: Diagnosis not present

## 2018-03-14 DIAGNOSIS — G4730 Sleep apnea, unspecified: Secondary | ICD-10-CM | POA: Diagnosis not present

## 2018-03-14 DIAGNOSIS — Z885 Allergy status to narcotic agent status: Secondary | ICD-10-CM | POA: Diagnosis not present

## 2018-03-14 DIAGNOSIS — I10 Essential (primary) hypertension: Secondary | ICD-10-CM | POA: Diagnosis not present

## 2018-03-14 DIAGNOSIS — E785 Hyperlipidemia, unspecified: Secondary | ICD-10-CM | POA: Diagnosis not present

## 2018-03-14 DIAGNOSIS — K810 Acute cholecystitis: Secondary | ICD-10-CM | POA: Diagnosis present

## 2018-03-14 DIAGNOSIS — Z7982 Long term (current) use of aspirin: Secondary | ICD-10-CM

## 2018-03-14 DIAGNOSIS — K819 Cholecystitis, unspecified: Secondary | ICD-10-CM

## 2018-03-14 DIAGNOSIS — Z9989 Dependence on other enabling machines and devices: Secondary | ICD-10-CM

## 2018-03-14 DIAGNOSIS — K8020 Calculus of gallbladder without cholecystitis without obstruction: Secondary | ICD-10-CM

## 2018-03-14 HISTORY — PX: CHOLECYSTECTOMY: SHX55

## 2018-03-14 LAB — CBC WITH DIFFERENTIAL/PLATELET
Abs Immature Granulocytes: 0.01 10*3/uL (ref 0.00–0.07)
BASOS PCT: 1 %
Basophils Absolute: 0 10*3/uL (ref 0.0–0.1)
EOS ABS: 0 10*3/uL (ref 0.0–0.5)
Eosinophils Relative: 0 %
HCT: 42.3 % (ref 39.0–52.0)
Hemoglobin: 13.9 g/dL (ref 13.0–17.0)
Immature Granulocytes: 0 %
Lymphocytes Relative: 17 %
Lymphs Abs: 1 10*3/uL (ref 0.7–4.0)
MCH: 30.2 pg (ref 26.0–34.0)
MCHC: 32.9 g/dL (ref 30.0–36.0)
MCV: 92 fL (ref 80.0–100.0)
Monocytes Absolute: 1 10*3/uL (ref 0.1–1.0)
Monocytes Relative: 16 %
NRBC: 0 % (ref 0.0–0.2)
Neutro Abs: 4.2 10*3/uL (ref 1.7–7.7)
Neutrophils Relative %: 66 %
Platelets: 211 10*3/uL (ref 150–400)
RBC: 4.6 MIL/uL (ref 4.22–5.81)
RDW: 13 % (ref 11.5–15.5)
WBC: 6.3 10*3/uL (ref 4.0–10.5)

## 2018-03-14 LAB — COMPREHENSIVE METABOLIC PANEL
ALT: 18 U/L (ref 0–44)
AST: 42 U/L — ABNORMAL HIGH (ref 15–41)
Albumin: 4.1 g/dL (ref 3.5–5.0)
Alkaline Phosphatase: 55 U/L (ref 38–126)
Anion gap: 6 (ref 5–15)
BUN: 14 mg/dL (ref 8–23)
CO2: 26 mmol/L (ref 22–32)
Calcium: 8.5 mg/dL — ABNORMAL LOW (ref 8.9–10.3)
Chloride: 103 mmol/L (ref 98–111)
Creatinine, Ser: 0.99 mg/dL (ref 0.61–1.24)
GFR calc Af Amer: >60 mL/min (ref >60)
GFR calc non Af Amer: >60 mL/min (ref >60)
Glucose, Bld: 108 mg/dL — ABNORMAL HIGH (ref 70–99)
POTASSIUM: 4.4 mmol/L (ref 3.5–5.1)
Sodium: 135 mmol/L (ref 135–145)
Total Bilirubin: 0.6 mg/dL (ref 0.3–1.2)
Total Protein: 7.3 g/dL (ref 6.5–8.1)

## 2018-03-14 LAB — LIPASE, BLOOD: Lipase: 25 U/L (ref 11–51)

## 2018-03-14 LAB — URINALYSIS, COMPLETE (UACMP) WITH MICROSCOPIC
Bacteria, UA: NONE SEEN
Bilirubin Urine: NEGATIVE
Glucose, UA: NEGATIVE mg/dL
Ketones, ur: 5 mg/dL — AB
Leukocytes, UA: NEGATIVE
Nitrite: NEGATIVE
Protein, ur: NEGATIVE mg/dL
Specific Gravity, Urine: 1.039 — ABNORMAL HIGH (ref 1.005–1.030)
Squamous Epithelial / HPF: NONE SEEN (ref 0–5)
pH: 6 (ref 5.0–8.0)

## 2018-03-14 SURGERY — LAPAROSCOPIC CHOLECYSTECTOMY
Anesthesia: General

## 2018-03-14 MED ORDER — PRIMIDONE 50 MG PO TABS
50.0000 mg | ORAL_TABLET | Freq: Two times a day (BID) | ORAL | Status: DC
  Administered 2018-03-14 – 2018-03-15 (×3): 50 mg via ORAL
  Filled 2018-03-14 (×5): qty 1

## 2018-03-14 MED ORDER — ONDANSETRON 4 MG PO TBDP: 4.0000 mg | ORAL_TABLET | Freq: Four times a day (QID) | ORAL | Status: DC | PRN

## 2018-03-14 MED ORDER — ONDANSETRON HCL 4 MG/2ML IJ SOLN
INTRAMUSCULAR | Status: DC | PRN
  Administered 2018-03-14: 4 mg via INTRAVENOUS

## 2018-03-14 MED ORDER — EPHEDRINE SULFATE 50 MG/ML IJ SOLN
INTRAMUSCULAR | Status: DC | PRN
  Administered 2018-03-14: 10 mg via INTRAVENOUS

## 2018-03-14 MED ORDER — PROPOFOL 10 MG/ML IV BOLUS
INTRAVENOUS | Status: DC | PRN
  Administered 2018-03-14: 120 mg via INTRAVENOUS

## 2018-03-14 MED ORDER — ENOXAPARIN SODIUM 40 MG/0.4ML ~~LOC~~ SOLN
40.0000 mg | SUBCUTANEOUS | Status: DC
  Administered 2018-03-14: 40 mg via SUBCUTANEOUS
  Filled 2018-03-14: qty 0.4

## 2018-03-14 MED ORDER — IOPAMIDOL (ISOVUE-300) INJECTION 61%
100.0000 mL | Freq: Once | INTRAVENOUS | Status: AC | PRN
  Administered 2018-03-14: 100 mL via INTRAVENOUS

## 2018-03-14 MED ORDER — BUPIVACAINE-EPINEPHRINE (PF) 0.5% -1:200000 IJ SOLN
INTRAMUSCULAR | Status: DC | PRN
  Administered 2018-03-14: 18 mL

## 2018-03-14 MED ORDER — KETOROLAC TROMETHAMINE 30 MG/ML IJ SOLN
15.0000 mg | Freq: Once | INTRAMUSCULAR | Status: AC
  Administered 2018-03-14: 15 mg via INTRAVENOUS
  Filled 2018-03-14: qty 1

## 2018-03-14 MED ORDER — SUCCINYLCHOLINE CHLORIDE 20 MG/ML IJ SOLN
INTRAMUSCULAR | Status: DC | PRN
  Administered 2018-03-14: 100 mg via INTRAVENOUS

## 2018-03-14 MED ORDER — PIPERACILLIN-TAZOBACTAM 3.375 G IVPB
3.3750 g | Freq: Three times a day (TID) | INTRAVENOUS | Status: DC
  Administered 2018-03-14 – 2018-03-15 (×2): 3.375 g via INTRAVENOUS
  Filled 2018-03-14 (×2): qty 50

## 2018-03-14 MED ORDER — HYDROCODONE-ACETAMINOPHEN 5-325 MG PO TABS
1.0000 | ORAL_TABLET | ORAL | Status: DC | PRN
  Administered 2018-03-14 – 2018-03-15 (×3): 1 via ORAL
  Filled 2018-03-14 (×3): qty 1

## 2018-03-14 MED ORDER — ONDANSETRON HCL 4 MG/2ML IJ SOLN
4.0000 mg | Freq: Once | INTRAMUSCULAR | Status: AC
  Administered 2018-03-14: 4 mg via INTRAVENOUS
  Filled 2018-03-14: qty 2

## 2018-03-14 MED ORDER — ACETAMINOPHEN 325 MG PO TABS: 650.0000 mg | ORAL_TABLET | Freq: Four times a day (QID) | ORAL | Status: DC | PRN

## 2018-03-14 MED ORDER — LACTATED RINGERS IV SOLN
INTRAVENOUS | Status: DC | PRN
  Administered 2018-03-14 (×2): via INTRAVENOUS

## 2018-03-14 MED ORDER — EPINEPHRINE PF 1 MG/ML IJ SOLN
INTRAMUSCULAR | Status: AC
  Filled 2018-03-14: qty 1

## 2018-03-14 MED ORDER — BUPIVACAINE HCL (PF) 0.5 % IJ SOLN
INTRAMUSCULAR | Status: AC
  Filled 2018-03-14: qty 30

## 2018-03-14 MED ORDER — MORPHINE SULFATE (PF) 4 MG/ML IV SOLN
4.0000 mg | INTRAVENOUS | Status: DC | PRN
  Administered 2018-03-14: 4 mg via INTRAVENOUS
  Filled 2018-03-14: qty 1

## 2018-03-14 MED ORDER — SODIUM CHLORIDE 0.9 % IV BOLUS
500.0000 mL | Freq: Once | INTRAVENOUS | Status: AC
  Administered 2018-03-14: 500 mL via INTRAVENOUS

## 2018-03-14 MED ORDER — SODIUM CHLORIDE 0.9 % IV SOLN
INTRAVENOUS | Status: DC | PRN
  Administered 2018-03-14 – 2018-03-15 (×2): 250 mL via INTRAVENOUS

## 2018-03-14 MED ORDER — HYDROMORPHONE HCL 1 MG/ML IJ SOLN: 0.2500 mg | INTRAMUSCULAR | Status: DC | PRN

## 2018-03-14 MED ORDER — MORPHINE SULFATE (PF) 4 MG/ML IV SOLN: 4.0000 mg | INTRAVENOUS | Status: DC | PRN

## 2018-03-14 MED ORDER — FENTANYL CITRATE (PF) 100 MCG/2ML IJ SOLN
INTRAMUSCULAR | Status: DC | PRN
  Administered 2018-03-14 (×2): 50 ug via INTRAVENOUS

## 2018-03-14 MED ORDER — SUGAMMADEX SODIUM 200 MG/2ML IV SOLN
INTRAVENOUS | Status: DC | PRN
  Administered 2018-03-14: 200 mg via INTRAVENOUS

## 2018-03-14 MED ORDER — DEXAMETHASONE SODIUM PHOSPHATE 10 MG/ML IJ SOLN
INTRAMUSCULAR | Status: DC | PRN
  Administered 2018-03-14: 10 mg via INTRAVENOUS

## 2018-03-14 MED ORDER — DOXAZOSIN MESYLATE 2 MG PO TABS
2.0000 mg | ORAL_TABLET | Freq: Every day | ORAL | Status: DC
  Administered 2018-03-14 – 2018-03-15 (×2): 2 mg via ORAL
  Filled 2018-03-14 (×2): qty 1

## 2018-03-14 MED ORDER — ONDANSETRON HCL 4 MG/2ML IJ SOLN: 4.0000 mg | Freq: Four times a day (QID) | INTRAMUSCULAR | Status: DC | PRN

## 2018-03-14 MED ORDER — FENTANYL CITRATE (PF) 100 MCG/2ML IJ SOLN
INTRAMUSCULAR | Status: AC
  Filled 2018-03-14: qty 2

## 2018-03-14 MED ORDER — PANTOPRAZOLE SODIUM 40 MG PO TBEC
40.0000 mg | DELAYED_RELEASE_TABLET | Freq: Every day | ORAL | Status: DC
  Administered 2018-03-14 – 2018-03-15 (×2): 40 mg via ORAL
  Filled 2018-03-14 (×2): qty 1

## 2018-03-14 MED ORDER — SODIUM CHLORIDE 0.9 % IV SOLN
INTRAVENOUS | Status: DC
  Administered 2018-03-14 – 2018-03-15 (×2): via INTRAVENOUS

## 2018-03-14 MED ORDER — ACETAMINOPHEN 650 MG RE SUPP: 650.0000 mg | Freq: Four times a day (QID) | RECTAL | Status: DC | PRN

## 2018-03-14 MED ORDER — PIPERACILLIN-TAZOBACTAM 3.375 G IVPB 30 MIN
3.3750 g | Freq: Once | INTRAVENOUS | Status: AC
  Administered 2018-03-14: 3.375 g via INTRAVENOUS
  Filled 2018-03-14: qty 50

## 2018-03-14 MED ORDER — ROCURONIUM BROMIDE 100 MG/10ML IV SOLN
INTRAVENOUS | Status: DC | PRN
  Administered 2018-03-14: 20 mg via INTRAVENOUS
  Administered 2018-03-14: 30 mg via INTRAVENOUS

## 2018-03-14 MED ORDER — LIDOCAINE HCL (CARDIAC) PF 100 MG/5ML IV SOSY
PREFILLED_SYRINGE | INTRAVENOUS | Status: DC | PRN
  Administered 2018-03-14: 80 mg via INTRAVENOUS

## 2018-03-14 SURGICAL SUPPLY — 35 items
APPLIER CLIP 5 13 M/L LIGAMAX5 (MISCELLANEOUS) ×3
BLADE SURG SZ11 CARB STEEL (BLADE) ×3 IMPLANT
CANISTER SUCT 1200ML W/VALVE (MISCELLANEOUS) ×3 IMPLANT
CHLORAPREP W/TINT 26ML (MISCELLANEOUS) ×3 IMPLANT
CLIP APPLIE 5 13 M/L LIGAMAX5 (MISCELLANEOUS) ×1 IMPLANT
COVER WAND RF STERILE (DRAPES) IMPLANT
DERMABOND ADVANCED (GAUZE/BANDAGES/DRESSINGS) ×2
DERMABOND ADVANCED .7 DNX12 (GAUZE/BANDAGES/DRESSINGS) ×1 IMPLANT
ELECT REM PT RETURN 9FT ADLT (ELECTROSURGICAL) ×3
ELECTRODE REM PT RTRN 9FT ADLT (ELECTROSURGICAL) ×1 IMPLANT
GLOVE BIO SURGEON STRL SZ 6.5 (GLOVE) ×8 IMPLANT
GLOVE BIO SURGEONS STRL SZ 6.5 (GLOVE) ×4
GOWN STRL REUS W/ TWL LRG LVL3 (GOWN DISPOSABLE) ×2 IMPLANT
GOWN STRL REUS W/TWL LRG LVL3 (GOWN DISPOSABLE) ×4
GRASPER SUT TROCAR 14GX15 (MISCELLANEOUS) ×3 IMPLANT
HEMOSTAT SURGICEL 2X3 (HEMOSTASIS) IMPLANT
IRRIGATION STRYKERFLOW (MISCELLANEOUS) ×1 IMPLANT
IRRIGATOR STRYKERFLOW (MISCELLANEOUS) ×3
IV NS 1000ML (IV SOLUTION) ×2
IV NS 1000ML BAXH (IV SOLUTION) ×1 IMPLANT
KIT TURNOVER KIT A (KITS) ×3 IMPLANT
LABEL OR SOLS (LABEL) ×3 IMPLANT
NEEDLE HYPO 25X1 1.5 SAFETY (NEEDLE) ×3 IMPLANT
NEEDLE INSUFFLATION 14GA 120MM (NEEDLE) ×3 IMPLANT
NS IRRIG 500ML POUR BTL (IV SOLUTION) ×3 IMPLANT
PACK LAP CHOLECYSTECTOMY (MISCELLANEOUS) ×3 IMPLANT
POUCH SPECIMEN RETRIEVAL 10MM (ENDOMECHANICALS) ×3 IMPLANT
SCISSORS METZENBAUM CVD 33 (INSTRUMENTS) ×3 IMPLANT
SLEEVE ENDOPATH XCEL 5M (ENDOMECHANICALS) ×6 IMPLANT
SUT MNCRL AB 4-0 PS2 18 (SUTURE) ×3 IMPLANT
SUT VIC AB 0 CT1 36 (SUTURE) IMPLANT
SUT VICRYL 0 AB UR-6 (SUTURE) ×3 IMPLANT
TROCAR XCEL NON-BLD 11X100MML (ENDOMECHANICALS) ×3 IMPLANT
TROCAR XCEL NON-BLD 5MMX100MML (ENDOMECHANICALS) ×3 IMPLANT
TUBING INSUFFLATION (TUBING) ×3 IMPLANT

## 2018-03-14 NOTE — ED Triage Notes (Signed)
Pt to ED via POV c/o epigastric abdominal pain x 1 week, pt states that the pain would come and go then last night it got worse. Pt denies N/V/D, fever. Pt states that his blood pressure was elevated yesterday. Pt is in NAD at this time.

## 2018-03-14 NOTE — H&P (Signed)
SURGICAL HISTORY AND PHYSICAL NOTE   HISTORY OF PRESENT ILLNESS (HPI):  73 y.o. male presented to [facility] ED for evaluation of abdominal pain. Patient reports pain started to have pain intermittently since last week. The pain started yesterday night. Refers pain on the peri umbilical area and now on the right upper quadrant. Pain radiates to the back. No alleviator factor. Aggravator factor is palpation of the abdomen. No associated food pain. Denies nausea or vomiting. Denies fever, denies diarrhea.  Patient came to ED and CT scan was done. No significant pathology found. Abdominal ultrasound shows gallbladder wall thickening and pericholecystic fluid. I personally evaluated the images.   Surgery is consulted by Dr. Quentin Cornwall in this context for evaluation and management of acute cholecystitis.  PAST MEDICAL HISTORY (PMH):  Past Medical History:  Diagnosis Date  . Allergic rhinitis   . Benign hypertension   . BPH (benign prostatic hypertrophy)   . Hyperlipidemia   . Mechanical low back pain   . Plantar fasciitis    h/o heel spur  . Sleep apnea    Obstructive; Uses CPAP     PAST SURGICAL HISTORY ([place]):  Past Surgical History:  Procedure Laterality Date  . COLONOSCOPY  11/10/2005; 02/01/2010   With adenomatous polyp  . COLONOSCOPY WITH PROPOFOL N/A 03/08/2015   Procedure: COLONOSCOPY WITH PROPOFOL;  Surgeon: Manya Silvas, MD;  Location: [facility] ENDOSCOPY;  Service: Endoscopy;  Laterality: N/A;  . HERNIA REPAIR     left inguinal     MEDICATIONS:  Prior to Admission medications   Medication Sig Start Date End Date Taking? Authorizing Provider  aspirin EC 81 MG tablet Take 81 mg by mouth daily.   Yes [provider]  Boswellia-Glucosamine-Vit D (GLUCOSAMINE COMPLEX PO) Take by mouth.   Yes [provider]  doxazosin (CARDURA) 2 MG tablet Take 2 mg by mouth daily.   Yes [provider]  glucosamine-chondroitin 500-400 MG tablet Take 1 tablet by mouth daily at  6 (six) AM.   Yes [provider]  omeprazole (PRILOSEC) 20 MG capsule Take by mouth. 07/03/16  Yes [provider]  primidone (MYSOLINE) 50 MG tablet Take 50 mg by mouth 2 (two) times daily.   Yes [provider]  vitamin B-12 (CYANOCOBALAMIN) 250 MCG tablet Take by mouth.   Yes [provider]     ALLERGIES:  Allergies  Allergen Reactions  . Darvon [Propoxyphene] Nausea And Vomiting and Nausea Only    Other reaction(s): Vomiting     SOCIAL HISTORY:  Social History   Socioeconomic History  . Marital status: Married    Spouse name: Not on file  . Number of children: Not on file  . Years of education: Not on file  . Highest education level: Not on file  Occupational History  . Not on file  Social Needs  . Financial resource strain: Not on file  . Food insecurity:    Worry: Not on file    Inability: Not on file  . Transportation needs:    Medical: Not on file    Non-medical: Not on file  Tobacco Use  . Smoking status: Never Smoker  . Smokeless tobacco: Never Used  Substance and Sexual Activity  . Alcohol use: No  . Drug use: No  . Sexual activity: Not on file  Lifestyle  . Physical activity:    Days per week: Not on file    Minutes per session: Not on file  . Stress: Not on file  Relationships  .  Social connections:    Talks on phone: Not on file    Gets together: Not on file    Attends religious service: Not on file    Active member of club or organization: Not on file    Attends meetings of clubs or organizations: Not on file    Relationship status: Not on file  . Intimate partner violence:    Fear of current or ex partner: Not on file    Emotionally abused: Not on file    Physically abused: Not on file    Forced sexual activity: Not on file  Other Topics Concern  . Not on file  Social History Narrative  . Not on file    The patient currently resides (home / rehab facility / nursing home): Home The patient normally is  (ambulatory / bedbound): Ambulatory   FAMILY HISTORY:  Family History  Problem Relation Age of Onset  . Prostate cancer Neg Hx   . Renal cancer Neg Hx      REVIEW OF SYSTEMS:  Constitutional: denies weight loss, fever, chills, or sweats  Eyes: denies any other vision changes, history of eye injury  ENT: denies sore throat, hearing problems  Respiratory: denies shortness of breath, wheezing  Cardiovascular: denies chest pain, palpitations  Gastrointestinal: positive abdominal pain, Negative nausea or vomiting. Genitourinary: denies burning with urination or urinary frequency Musculoskeletal: denies any other joint pains or cramps  Skin: denies any other rashes or skin discolorations  Neurological: denies any other headache, dizziness, weakness  Psychiatric: denies any other depression, anxiety   All other review of systems were negative   VITAL SIGNS:  Temp:  [98.8 F (37.1 C)] 98.8 F (37.1 C) (01/26 0710) Pulse Rate:  [49-62] 55 (01/26 0900) Resp:  [9-19] 12 (01/26 0900) BP: (148-180)/(72-84) 153/77 (01/26 0900) SpO2:  [95 %-97 %] 97 % (01/26 0900) Weight:  [83.9 kg] 83.9 kg (01/26 0711)     Height: 5\' 10"  (177.8 cm) Weight: 83.9 kg BMI (Calculated): 26.54   INTAKE/OUTPUT:  This shift: No intake/output data recorded.  Last 2 shifts: @IOLAST2SHIFTS @   PHYSICAL EXAM:  Constitutional:  -- Normal body habitus  -- Awake, alert, and oriented x3  Eyes:  -- Pupils equally round and reactive to light  -- No scleral icterus  Ear, nose, and throat:  -- No jugular venous distension  Pulmonary:  -- No crackles  -- Equal breath sounds bilaterally -- Breathing non-labored at rest Cardiovascular:  -- S1, S2 present  -- No pericardial rubs Gastrointestinal:  -- Abdomen soft, tender to palpation on the right upper quadrant, non-distended, no guarding or rebound tenderness -- No abdominal masses appreciated, pulsatile or otherwise  Musculoskeletal and Integumentary:  -- Wounds  or skin discoloration: None appreciated -- Extremities: B/L UE and LE FROM, hands and feet warm, no edema  Neurologic:  -- Motor function: intact and symmetric -- Sensation: intact and symmetric   Labs:  CBC Latest Ref Rng & Units 03/14/2018  WBC 4.0 - 10.5 K/uL 6.3  Hemoglobin 13.0 - 17.0 g/dL 13.9  Hematocrit 39.0 - 52.0 % 42.3  Platelets 150 - 400 K/uL 211   CMP Latest Ref Rng & Units 03/14/2018  Glucose 70 - 99 mg/dL 108(H)  BUN 8 - 23 mg/dL 14  Creatinine 0.61 - 1.24 mg/dL 0.99  Sodium 135 - 145 mmol/L 135  Potassium 3.5 - 5.1 mmol/L 4.4  Chloride 98 - 111 mmol/L 103  CO2 22 - 32 mmol/L 26  Calcium 8.9 - 10.3  mg/dL 8.5(L)  Total Protein 6.5 - 8.1 g/dL 7.3  Total Bilirubin 0.3 - 1.2 mg/dL 0.6  Alkaline Phos 38 - 126 U/L 55  AST 15 - 41 U/L 42(H)  ALT 0 - 44 U/L 18   Imaging studies:  EXAM: ULTRASOUND ABDOMEN LIMITED RIGHT UPPER QUADRANT  COMPARISON:  Abdomen and pelvis CT obtained earlier today.  FINDINGS: Gallbladder:  Gallstones and sludge filling the gallbladder. The largest visible individual stone measures 2.8 cm in maximum diameter. Gallbladder wall thickening with a maximum thickness of 5.5 mm. Small amount of pericholecystic fluid. There was a positive sonographic Murphy sign.  Common bile duct:  Diameter: 5.0 mm  Liver:  No focal lesion identified. Within normal limits in parenchymal echogenicity. Portal vein is patent on color Doppler imaging with normal direction of blood flow towards the liver.  IMPRESSION: Cholelithiasis, gallbladder wall thickening, pericholecystic fluid and positive sonographic Murphy sign. This combination of findings is compatible with acute cholecystitis.   Electronically Signed   By: Claudie Revering M.D.   On: 03/14/2018 10:36  Assessment/Plan:  73 y.o. male with acute cholecystitis, complicated by pertinent comorbidities including Hypertension, BPH, hyperlipidemia and sleep apnea.  Patient with history,  physical exam and images consistent with acute cholecystitis. Patient oriented about diagnosis and surgical management as treatment. Patient oriented about goals of surgery and its risk including: bowel injury, infection, abscess, bleeding, leak bile duct, intestinal adhesions, bowel obstruction, fistula, injury to the liver, common bile duct, stomach, duodenum, among others.  Patient understood and agreed to proceed with surgery. Will admit patient, already started on antibiotic therapy, will give IV hydration since patient is NPO and schedule to OR.   Arnold Long, MD

## 2018-03-14 NOTE — Progress Notes (Signed)
Verbal order from MD to restart home medication primidone 50mg  twice a day.   Justin Torres [employer]

## 2018-03-14 NOTE — Anesthesia Post-op Follow-up Note (Signed)
Anesthesia QCDR form completed.        

## 2018-03-14 NOTE — ED Provider Notes (Signed)
[facility] Emergency Department Provider Note    First MD Initiated Contact with Patient 03/14/18 [phone]     (approximate)  I have reviewed the triage vital signs and the nursing notes.   HISTORY  Chief Complaint Abdominal Pain    HPI Justin Torres is a 73 y.o. male with the below listed past medical history presents the ER for mild to moderate intermittent mid epigastric pain without radiation.  States this started roughly 1 week ago.  Uncertain as to what started symptoms.   Denies any diarrhea.  States he does feel nauseated and like he is been previous surgery for hernia repair.  States that last night around 2:00 he had severe epigastric discomfort.  He woke up and took Aleve with some improvement in symptoms.  Has never had pain like this before.   Past Medical History:  Diagnosis Date  . Allergic rhinitis   . Benign hypertension   . BPH (benign prostatic hypertrophy)   . Hyperlipidemia   . Mechanical low back pain   . Plantar fasciitis    h/o heel spur  . Sleep apnea    Obstructive; Uses CPAP   Family History  Problem Relation Age of Onset  . Prostate cancer Neg Hx   . Renal cancer Neg Hx    Past Surgical History:  Procedure Laterality Date  . COLONOSCOPY  11/10/2005; 02/01/2010   With adenomatous polyp  . COLONOSCOPY WITH PROPOFOL N/A 03/08/2015   Procedure: COLONOSCOPY WITH PROPOFOL;  Surgeon: Manya Silvas, MD;  Location: [facility] ENDOSCOPY;  Service: Endoscopy;  Laterality: N/A;  . HERNIA REPAIR     left inguinal   Patient Active Problem List   Diagnosis Date Noted  . Acute cholecystitis 03/14/2018  . Elevated PSA 02/25/2016  . Weak urinary stream 02/25/2016      Prior to Admission medications   Medication Sig Start Date End Date Taking? Authorizing Provider  aspirin EC 81 MG tablet Take 81 mg by mouth daily.   Yes [provider]  Boswellia-Glucosamine-Vit D (GLUCOSAMINE COMPLEX PO) Take by mouth.   Yes [provider]  doxazosin (CARDURA) 2 MG tablet Take 2 mg by mouth daily.   Yes [provider]  glucosamine-chondroitin 500-400 MG tablet Take 1 tablet by mouth daily at 6 (six) AM.   Yes [provider]  omeprazole (PRILOSEC) 20 MG capsule Take by mouth. 07/03/16  Yes [provider]  primidone (MYSOLINE) 50 MG tablet Take 50 mg by mouth 2 (two) times daily.   Yes [provider]  vitamin B-12 (CYANOCOBALAMIN) 250 MCG tablet Take by mouth.   Yes [provider]    Allergies Darvon [propoxyphene]    Social History Social History   Tobacco Use  . Smoking status: Never Smoker  . Smokeless tobacco: Never Used  Substance Use Topics  . Alcohol use: No  . Drug use: No    Review of Systems Patient denies headaches, rhinorrhea, blurry vision, numbness, shortness of breath, chest pain, edema, cough, abdominal pain, nausea, vomiting, diarrhea, dysuria, fevers, rashes or hallucinations unless otherwise stated above in HPI. ____________________________________________   PHYSICAL EXAM:  VITAL SIGNS: Vitals:   03/14/18 1335 03/14/18 1336  BP: 129/72 129/72  Pulse: 67 69  Resp: 16 17  Temp: (!) 97.5 F (36.4 C) 98.6 F (37 C)  SpO2: 98% 98%    Constitutional: Alert and oriented.  Eyes: Conjunctivae are normal.  Head: Atraumatic. Nose: No congestion/rhinnorhea. Mouth/Throat: Mucous membranes are moist.  Neck: No stridor. Painless ROM.  Cardiovascular: Normal rate, regular rhythm. Grossly normal heart sounds.  Good peripheral circulation. Respiratory: Normal respiratory effort.  No retractions. Lungs CTAB. Gastrointestinal: Soft and nontender. No distention. No abdominal bruits. No CVA tenderness. Genitourinary: deferred Musculoskeletal: No lower extremity tenderness nor edema.  No joint effusions. Neurologic:  Normal speech and language. No gross focal neurologic deficits are appreciated. No facial droop Skin:  Skin is warm, dry and  intact. No rash noted. Psychiatric: Mood and affect are normal. Speech and behavior are normal.  ____________________________________________   LABS (all labs ordered are listed, but only abnormal results are displayed)  Results for orders placed or performed during the hospital encounter of 03/14/18 (from the past 24 hour(s))  CBC with Differential/Platelet     Status: None   Collection Time: 03/14/18  7:55 AM  Result Value Ref Range   WBC 6.3 4.0 - 10.5 K/uL   RBC 4.60 4.22 - 5.81 MIL/uL   Hemoglobin 13.9 13.0 - 17.0 g/dL   HCT 42.3 39.0 - 52.0 %   MCV 92.0 80.0 - 100.0 fL   MCH 30.2 26.0 - 34.0 pg   MCHC 32.9 30.0 - 36.0 g/dL   RDW 13.0 11.5 - 15.5 %   Platelets 211 150 - 400 K/uL   nRBC 0.0 0.0 - 0.2 %   Neutrophils Relative % 66 %   Neutro Abs 4.2 1.7 - 7.7 K/uL   Lymphocytes Relative 17 %   Lymphs Abs 1.0 0.7 - 4.0 K/uL   Monocytes Relative 16 %   Monocytes Absolute 1.0 0.1 - 1.0 K/uL   Eosinophils Relative 0 %   Eosinophils Absolute 0.0 0.0 - 0.5 K/uL   Basophils Relative 1 %   Basophils Absolute 0.0 0.0 - 0.1 K/uL   Immature Granulocytes 0 %   Abs Immature Granulocytes 0.01 0.00 - 0.07 K/uL  Comprehensive metabolic panel     Status: Abnormal   Collection Time: 03/14/18  7:55 AM  Result Value Ref Range   Sodium 135 135 - 145 mmol/L   Potassium 4.4 3.5 - 5.1 mmol/L   Chloride 103 98 - 111 mmol/L   CO2 26 22 - 32 mmol/L   Glucose, Bld 108 (H) 70 - 99 mg/dL   BUN 14 8 - 23 mg/dL   Creatinine, Ser 0.99 0.61 - 1.24 mg/dL   Calcium 8.5 (L) 8.9 - 10.3 mg/dL   Total Protein 7.3 6.5 - 8.1 g/dL   Albumin 4.1 3.5 - 5.0 g/dL   AST 42 (H) 15 - 41 U/L   ALT 18 0 - 44 U/L   Alkaline Phosphatase 55 38 - 126 U/L   Total Bilirubin 0.6 0.3 - 1.2 mg/dL   GFR calc non Af Amer >60 >60 mL/min   GFR calc Af Amer >60 >60 mL/min   Anion gap 6 5 - 15  Lipase, blood     Status: None   Collection Time: 03/14/18  7:55 AM  Result Value Ref Range   Lipase 25 11 - 51 U/L  Urinalysis,  Complete w Microscopic     Status: Abnormal   Collection Time: 03/14/18 10:19 AM  Result Value Ref Range   Color, Urine YELLOW (A) YELLOW   APPearance CLEAR (A) CLEAR   Specific Gravity, Urine 1.039 (H) 1.005 - 1.030   pH 6.0 5.0 - 8.0   Glucose, UA NEGATIVE NEGATIVE mg/dL   Hgb urine dipstick SMALL (A) NEGATIVE   Bilirubin Urine NEGATIVE NEGATIVE   Ketones, ur  5 (A) NEGATIVE mg/dL   Protein, ur NEGATIVE NEGATIVE mg/dL   Nitrite NEGATIVE NEGATIVE   Leukocytes, UA NEGATIVE NEGATIVE   RBC / HPF 11-20 0 - 5 RBC/hpf   WBC, UA 0-5 0 - 5 WBC/hpf   Bacteria, UA NONE SEEN NONE SEEN   Squamous Epithelial / LPF NONE SEEN 0 - 5   Mucus PRESENT    ____________________________________________  EKG My review and personal interpretation at Time: 7:49   Indication: epigastric pain  Rate: 55  Rhythm: sinus Axis: normal Other: normal intervals, no stemi ____________________________________________  RADIOLOGY  I personally reviewed all radiographic images ordered to evaluate for the above acute complaints and reviewed radiology reports and findings.  These findings were personally discussed with the patient.  Please see medical record for radiology report.  ____________________________________________   PROCEDURES  Procedure(s) performed:  Procedures    Critical Care performed: no ____________________________________________   INITIAL IMPRESSION / ASSESSMENT AND PLAN / ED COURSE  Pertinent labs & imaging results that were available during my care of the patient were reviewed by me and considered in my medical decision making (see chart for details).   DDX: gastitis, duodenitis, AAA, dissection, colitis, cholelithiasis, appendicitis, doubt acs  Justin Torres is a 73 y.o. who presents to the ED with symptoms as described above.  Patient hypertensive but in no acute distress.  Blood work IV fluids IV pain medication antiemetics as well as CT imaging will be ordered to evaluate for  the above differential.  Clinical Course as of Mar 14 1336  Sun Mar 14, 2018  1012 Discussed the results of CT imaging with the patient.  He is having right upper quadrant pain on exam therefore will order ultrasound to further characterize.  Doubt cholecystitis but may be having biliary colic.  Will give dose of Toradol.   [PR]  1146 Ultrasound shows evidence of acute cholecystitis.  I spoke with Dr. Peyton Najjar of general surgery who agrees to admit patient for surgical management.  Will give dose of IV antibiotics.   [PR]    Clinical Course User Index [PR] Merlyn Lot, MD     As part of my medical decision making, I reviewed the following data within the [place] notes reviewed and incorporated, Labs reviewed, notes from prior ED visits and [place] Controlled Substance Database   ____________________________________________   FINAL CLINICAL IMPRESSION(S) / ED DIAGNOSES  Final diagnoses:  Gall stones  Cholecystitis      NEW MEDICATIONS STARTED DURING THIS VISIT:  Current Discharge Medication List       Note:  This document was prepared using Dragon voice recognition software and may include unintentional dictation errors.    Merlyn Lot, MD 03/14/18 [phone]

## 2018-03-14 NOTE — ED Notes (Signed)
Patient transported to Ultrasound 

## 2018-03-14 NOTE — ED Notes (Signed)
Pt transported to surgery

## 2018-03-14 NOTE — Transfer of Care (Signed)
Immediate Anesthesia Transfer of Care Note  Patient: Justin Torres  Procedure(s) Performed: LAPAROSCOPIC CHOLECYSTECTOMY (N/A )  Patient Location: PACU  Anesthesia Type:General  Level of Consciousness: awake and alert   Airway & Oxygen Therapy: Patient Spontanous Breathing and Patient connected to face mask oxygen  Post-op Assessment: Report given to RN  Post vital signs: Reviewed and stable  Last Vitals:  Vitals Value Taken Time  BP 129/72 03/14/2018  1:36 PM  Temp    Pulse 69 03/14/2018  1:36 PM  Resp 17 03/14/2018  1:36 PM  SpO2 98 % 03/14/2018  1:36 PM  Vitals shown include unvalidated device data.  Last Pain:  Vitals:   03/14/18 0927  TempSrc:   PainSc: 4          Complications: No apparent anesthesia complications

## 2018-03-14 NOTE — Op Note (Signed)
Preoperative diagnosis: Acute cholecystitis.  Postoperative diagnosis: Acute cholecystitis.  Procedure: Laparoscopic Cholecystectomy.   Anesthesia: GETA   Surgeon: Dr. Windell Moment  Wound Classification: Clean Contaminated  Indications: Patient is a 73 y.o. male developed right upper quadrant pain and on workup was found to have cholelithiasis with a normal common duct and findings suggestive of cholecystitis. Laparoscopic cholecystectomy was elected.  Findings: Thick gallbladder wall with tense gallbladder and thick bile. Critical view of safety achieved Cystic duct and artery identified, ligated and divided Adequate hemostasis  Description of procedure: The patient was placed on the operating table in the supine position. General anesthesia was induced. A time-out was completed verifying correct patient, procedure, site, positioning, and implant(s) and/or special equipment prior to beginning this procedure. An orogastric tube was placed. The abdomen was prepped and draped in the usual sterile fashion.  An incision was made above the umbilicus.  The fascia was elevated and the Veress needle inserted. Proper position was confirmed by aspiration and saline meniscus test.  The abdomen was insufflated with carbon dioxide to a pressure of 15 mmHg. The patient tolerated insufflation well. A 11-mm trocar was then inserted.  The laparoscope was inserted and the abdomen inspected. No injuries from initial trocar placement were noted. Additional trocars were then inserted in the following locations: a 5-mm trocar in the right epigastrium and two 5-mm trocars along the right costal margin. The abdomen was inspected and no abnormalities were found. The table was placed in the reverse Trendelenburg position with the right side up.  Filmy adhesions between the gallbladder and omentum, duodenum and transverse colon were lysed sharply. The gallbladder was so thick that it was not able to be graseped and  needed to be drained through the dome. The dome of the gallbladder was grasped with an atraumatic grasper passed through the lateral port and retracted over the dome of the liver. The infundibulum was also grasped with an atraumatic grasper through the midclavicular port and retracted toward the right lower quadrant. This maneuver exposed Calot's triangle. The peritoneum overlying the gallbladder infundibulum was then incised and the cystic duct and cystic artery identified and circumferentially dissected. Critical view of safety reviewed before ligating any structure. The cystic duct and cystic artery were then doubly clipped and divided close to the gallbladder.  The gallbladder was then dissected from its peritoneal attachments by electrocautery. Hemostasis was checked and the gallbladder and contained stones were removed using an endoscopic retrieval bag placed through the umbilical port. The gallbladder was passed off the table as a specimen. The gallbladder fossa was copiously irrigated with saline and hemostasis was obtained. There was no evidence of bleeding from the gallbladder fossa or cystic artery or leakage of the bile from the cystic duct stump. Secondary trocars were removed under direct vision. No bleeding was noted. The laparoscope was withdrawn and the umbilical trocar removed. The abdomen was allowed to collapse. The fascia of the 64mm trocar sites was closed with figure-of-eight 0 vicryl sutures. The skin was closed with subcuticular sutures of 4-0 monocryl and topical skin adhesive. The orogastric tube was removed.  The patient tolerated the procedure well and was taken to the postanesthesia care unit in stable condition.   Specimen: Gallbladder  Complications: None  EBL: 10 mL

## 2018-03-14 NOTE — Anesthesia Preprocedure Evaluation (Addendum)
Anesthesia Evaluation  Patient identified by MRN, date of birth, ID band Patient awake    Reviewed: Allergy & Precautions, H&P , NPO status , Patient's Chart, lab work & pertinent test results  Airway Mallampati: III  TM Distance: <3 FB     Dental  (+) Chipped   Pulmonary sleep apnea , neg COPD,           Cardiovascular hypertension, (-) Past MI negative cardio ROS       Neuro/Psych negative neurological ROS  negative psych ROS   GI/Hepatic negative GI ROS, Neg liver ROS,   Endo/Other  negative endocrine ROS  Renal/GU      Musculoskeletal   Abdominal   Peds  Hematology negative hematology ROS (+)   Anesthesia Other Findings Past Medical History: No date: Allergic rhinitis No date: Benign hypertension No date: BPH (benign prostatic hypertrophy) No date: Hyperlipidemia No date: Mechanical low back pain No date: Plantar fasciitis     Comment:  h/o heel spur No date: Sleep apnea     Comment:  Obstructive; Uses CPAP  Past Surgical History: 11/10/2005; 02/01/2010: COLONOSCOPY     Comment:  With adenomatous polyp 03/08/2015: COLONOSCOPY WITH PROPOFOL; N/A     Comment:  Procedure: COLONOSCOPY WITH PROPOFOL;  Surgeon: Manya Silvas, MD;  Location: [facility] ENDOSCOPY;  Service:               Endoscopy;  Laterality: N/A; No date: HERNIA REPAIR     Comment:  left inguinal  BMI    Body Mass Index:  26.54 kg/m      Reproductive/Obstetrics negative OB ROS                            Anesthesia Physical Anesthesia Plan  ASA: II  Anesthesia Plan: General ETT   Post-op Pain Management:    Induction:   PONV Risk Score and Plan: Ondansetron, Dexamethasone, Midazolam and Treatment may vary due to age or medical condition  Airway Management Planned:   Additional Equipment:   Intra-op Plan:   Post-operative Plan:   Informed Consent: I have reviewed the patients History  and Physical, chart, labs and discussed the procedure including the risks, benefits and alternatives for the proposed anesthesia with the patient or authorized representative who has indicated his/her understanding and acceptance.     Dental Advisory Given  Plan Discussed with: Anesthesiologist and CRNA  Anesthesia Plan Comments:         Anesthesia Quick Evaluation

## 2018-03-14 NOTE — Anesthesia Procedure Notes (Signed)
Procedure Name: Intubation Performed by: Lesle Reek, CRNA Pre-anesthesia Checklist: Timeout performed, Patient being monitored, Suction available, Emergency Drugs available and Patient identified Patient Re-evaluated:Patient Re-evaluated prior to induction Preoxygenation: Pre-oxygenation with 100% oxygen Induction Type: IV induction and Cricoid Pressure applied Ventilation: Mask ventilation without difficulty Laryngoscope Size: Mac and 3 Grade View: Grade II Tube type: Oral Tube size: 7.5 mm Number of attempts: 2 Placement Confirmation: ETT inserted through vocal cords under direct vision,  positive ETCO2,  CO2 detector and breath sounds checked- equal and bilateral Secured at: 22 cm Tube secured with: Tape

## 2018-03-15 ENCOUNTER — Encounter: Payer: Self-pay | Admitting: General Surgery

## 2018-03-15 MED ORDER — HYDROCODONE-ACETAMINOPHEN 5-325 MG PO TABS: 1.0000 | ORAL_TABLET | ORAL | 0 refills | Status: DC | PRN

## 2018-03-15 NOTE — Progress Notes (Signed)
Joey L Schwalb  A and O x 4. VSS. Pt tolerating diet well. No complaints of pain or nausea. IV removed intact, prescriptions given. Pt voiced understanding of discharge instructions with no further questions. Pt discharged via wheelchair.   Allergies as of 03/15/2018      Reactions   Darvon [propoxyphene] Nausea And Vomiting, Nausea Only   Other reaction(s): Vomiting      Medication List    TAKE these medications   aspirin EC 81 MG tablet Take 81 mg by mouth daily.   doxazosin 2 MG tablet Commonly known as:  CARDURA Take 2 mg by mouth daily.   GLUCOSAMINE COMPLEX PO Take by mouth.   glucosamine-chondroitin 500-400 MG tablet Take 1 tablet by mouth daily at 6 (six) AM.   HYDROcodone-acetaminophen 5-325 MG tablet Commonly known as:  NORCO/VICODIN Take 1 tablet by mouth every 4 (four) hours as needed for moderate pain.   omeprazole 20 MG capsule Commonly known as:  PRILOSEC Take by mouth.   primidone 50 MG tablet Commonly known as:  MYSOLINE Take 50 mg by mouth 2 (two) times daily.   vitamin B-12 250 MCG tablet Commonly known as:  CYANOCOBALAMIN Take by mouth.       Vitals:   03/14/18 1923 03/15/18 0452  BP: (!) 111/55 120/65  Pulse: 71 (!) 55  Resp: 16 16  Temp: 99.2 F (37.3 C) 97.9 F (36.6 C)  SpO2: 94% 96%    Francesco Sor

## 2018-03-15 NOTE — Discharge Summary (Signed)
  Patient ID: [name] MRN: [id number] DOB/AGE: [birth year] 73 y.o.  Admit date: 03/14/2018 Discharge date: 03/15/2018   Discharge Diagnoses:  Active Problems:   Acute cholecystitis   Procedures: Laparoscopic cholecystectomy  Hospital Course: Patient admitted with acute cholecystitis. Tolerated procedure well. Today tolerating soft diet, ambulating, pain control.   Physical Exam  Constitutional: He is well-developed, well-nourished, and in no distress.  Cardiovascular: Normal rate, regular rhythm and normal heart sounds.  Pulmonary/Chest: Effort normal.  Abdominal: Soft. Bowel sounds are normal. He exhibits no distension.  Wound are dry and clean.    Consults: None  Disposition: Discharge disposition: 01-Home or Self Care       Discharge Instructions    Diet - low sodium heart healthy   Complete by:  As directed      Allergies as of 03/15/2018      Reactions   Darvon [propoxyphene] Nausea And Vomiting, Nausea Only   Other reaction(s): Vomiting      Medication List    TAKE these medications   aspirin EC 81 MG tablet Take 81 mg by mouth daily.   doxazosin 2 MG tablet Commonly known as:  CARDURA Take 2 mg by mouth daily.   GLUCOSAMINE COMPLEX PO Take by mouth.   glucosamine-chondroitin 500-400 MG tablet Take 1 tablet by mouth daily at 6 (six) AM.   HYDROcodone-acetaminophen 5-325 MG tablet Commonly known as:  NORCO/VICODIN Take 1 tablet by mouth every 4 (four) hours as needed for moderate pain.   omeprazole 20 MG capsule Commonly known as:  PRILOSEC Take by mouth.   primidone 50 MG tablet Commonly known as:  MYSOLINE Take 50 mg by mouth 2 (two) times daily.   vitamin B-12 250 MCG tablet Commonly known as:  CYANOCOBALAMIN Take by mouth.      Follow-up Information    Herbert Pun, MD Follow up in 2 week(s).   Specialty:  General Surgery Contact information: [address] [place] [address] [phone]

## 2018-03-15 NOTE — Discharge Instructions (Signed)

## 2018-03-15 NOTE — Progress Notes (Signed)
Per MD okay for RN to DC iv fluids.  

## 2018-03-16 NOTE — Anesthesia Postprocedure Evaluation (Signed)
Anesthesia Post Note  Patient: Justin Torres  Procedure(s) Performed: LAPAROSCOPIC CHOLECYSTECTOMY (N/A )  Patient location during evaluation: PACU Anesthesia Type: General Level of consciousness: awake and alert Pain management: pain level controlled Vital Signs Assessment: post-procedure vital signs reviewed and stable Respiratory status: spontaneous breathing, nonlabored ventilation and respiratory function stable Cardiovascular status: blood pressure returned to baseline and stable Postop Assessment: no apparent nausea or vomiting Anesthetic complications: no     Last Vitals:  Vitals:   03/14/18 1923 03/15/18 0452  BP: (!) 111/55 120/65  Pulse: 71 (!) 55  Resp: 16 16  Temp: 37.3 C 36.6 C  SpO2: 94% 96%    Last Pain:  Vitals:   03/15/18 0728  TempSrc:   PainSc: 0-No pain                 Durenda Hurt

## 2018-03-17 LAB — SURGICAL PATHOLOGY

## 2018-03-26 ENCOUNTER — Telehealth: Payer: Self-pay | Admitting: Licensed Clinical Social Worker

## 2018-03-26 NOTE — Telephone Encounter (Signed)
CSW contacted patient regarding an EMMI call response. CSW spoke with patient this afternoon and patient states he is doing "just fine." He states that the option of feeling sadness, etc was an error. Patient had no concerns or issues to be addressed. Shela Leff MSW,LCSW [phone]

## 2018-10-26 ENCOUNTER — Ambulatory Visit: Payer: Medicare Other | Admitting: Urology

## 2018-11-01 ENCOUNTER — Encounter: Payer: Self-pay | Admitting: Urology

## 2018-11-01 ENCOUNTER — Other Ambulatory Visit: Payer: Self-pay

## 2018-11-01 ENCOUNTER — Ambulatory Visit (INDEPENDENT_AMBULATORY_CARE_PROVIDER_SITE_OTHER): Payer: Medicare Other | Admitting: Urology

## 2018-11-01 VITALS — BP 115/66 | HR 55 | Ht 71.0 in | Wt 190.0 lb

## 2018-11-01 DIAGNOSIS — N401 Enlarged prostate with lower urinary tract symptoms: Secondary | ICD-10-CM | POA: Diagnosis not present

## 2018-11-01 LAB — BLADDER SCAN AMB NON-IMAGING

## 2018-11-01 NOTE — Progress Notes (Signed)
   11/01/2018 9:30 AM   Delfino Lovett Carlean Jews Peter Garter 1945-03-15 [id number]  Reason for visit: Follow up BPH  HPI: I saw Mr. Encina back in urology clinic today for follow-up of BPH and left.  He has mild BPH and urinary symptoms well controlled on doxazosin 2 mg daily.  We previously followed him for PSA surveillance with mild elevation of 4.3 which was stable from 2017-2019, and he additionally underwent a negative prostate biopsy in December 2017 for an elevated PSA of 4.5.  At our last visit, we jointly agreed to discontinue PSA surveillance per the AUA guidelines based on his age and PSA stability.  Prostate measured 80 g on the prostate biopsy ultrasound in 2017.  He had a recent CT in the last year and prostate measured 96 g.  He has cut back significantly on Diet Coke which has significantly improved his urgency and frequency.  He denies any gross hematuria, urinary tract infections, or episodes of retention.  IPSS score in clinic today is 17, and PVR 0 mL.  ROS: Please see flowsheet from today's date for complete review of systems.  Physical Exam: BP 115/66   Pulse (!) 55   Ht 5\' 11"  (1.803 m)   Wt 190 lb (86.2 kg)   BMI 26.50 kg/m    Constitutional:  Alert and oriented, No acute distress. Respiratory: Normal respiratory effort, no increased work of breathing. GI: Abdomen is soft, nontender, nondistended, no abdominal masses Skin: No rashes, bruises or suspicious lesions. Neurologic: Grossly intact, no focal deficits, moving all 4 extremities. Psychiatric: Normal mood and affect   Assessment & Plan:   In summary, the patient is a 73 year old male with mild BPH symptoms well controlled on doxazosin 2 mg daily.  His prostate measures 96 g on CT from 2020.  He is currently satisfied with his symptoms and does not want to increase his dose or consider outlet procedures at this time.  RTC 1 year for symptom check and PVR Continue doxazosin  A total of 15 minutes were spent face-to-face  with the patient, greater than 50% was spent in patient education, counseling, and coordination of care regarding BPH and urinary symptoms.   Billey Co, [facility] [address] [place], [place] [address] [phone]

## 2018-11-01 NOTE — Patient Instructions (Signed)

## 2018-12-20 ENCOUNTER — Other Ambulatory Visit: Payer: Self-pay

## 2018-12-20 DIAGNOSIS — Z20822 Contact with and (suspected) exposure to covid-19: Secondary | ICD-10-CM

## 2018-12-22 LAB — NOVEL CORONAVIRUS, NAA: SARS-CoV-2, NAA: NOT DETECTED

## 2019-01-12 ENCOUNTER — Other Ambulatory Visit: Payer: Self-pay

## 2019-01-12 DIAGNOSIS — Z20822 Contact with and (suspected) exposure to covid-19: Secondary | ICD-10-CM

## 2019-01-13 LAB — NOVEL CORONAVIRUS, NAA: SARS-CoV-2, NAA: DETECTED — AB

## 2019-11-03 ENCOUNTER — Ambulatory Visit (INDEPENDENT_AMBULATORY_CARE_PROVIDER_SITE_OTHER): Payer: Medicare Other | Admitting: Urology

## 2019-11-03 ENCOUNTER — Encounter: Payer: Self-pay | Admitting: Urology

## 2019-11-03 ENCOUNTER — Other Ambulatory Visit: Payer: Self-pay

## 2019-11-03 VITALS — BP 145/78 | HR 50 | Ht 70.0 in | Wt 195.0 lb

## 2019-11-03 DIAGNOSIS — Z125 Encounter for screening for malignant neoplasm of prostate: Secondary | ICD-10-CM | POA: Diagnosis not present

## 2019-11-03 DIAGNOSIS — N401 Enlarged prostate with lower urinary tract symptoms: Secondary | ICD-10-CM | POA: Diagnosis not present

## 2019-11-03 LAB — BLADDER SCAN AMB NON-IMAGING: Scan Result: 0

## 2019-11-03 MED ORDER — DOXAZOSIN MESYLATE 2 MG PO TABS: 2.0000 mg | ORAL_TABLET | Freq: Every day | ORAL | 1 refills | Status: DC

## 2019-11-03 NOTE — Progress Notes (Signed)
   11/03/2019 8:57 AM   Delfino Lovett Carlean Jews Peter Garter February 16, 1946 [id number]  Reason for visit: BPH, PSA screening  HPI: I saw Mr. Justin Torres back in urology clinic for follow-up of BPH and PSA screening.  At our last visit in September 2020 he was doing well on doxazosin 2 mg daily, and was satisfied with his urinary symptoms.  He denies any changes in his urination over the last year except for maybe some mild increase in frequency.  He drinks coffee in the morning and water during the day, with a rare soda.  His IPSS score today is 21, with quality of life mixed, and PVR of 0 mL.  We reviewed different options including increasing the doxazosin, and we briefly reviewed outlet procedures including UroLift and HOLEP.  He is currently minimally bothered by his symptoms at this time, and would like to continue on his current dose of doxazosin.  Prostate measures 96 g on recent CT.  At her last visit we had also decided to discontinue PSA screening per the AUA guidelines with his age, history of negative biopsy, and stable PSA.  His PCP is continue to check his PSA, and this bumped slightly to 5.05 in January 2021, but on repeat in July 2021 was back to baseline of 3.55.  I again reviewed the AUA guidelines that recommends discontinuing screening at age 41, and I strongly recommended he discontinue PSA screening, in the setting of stable PSA that is within the normal range, and recent negative biopsy that he did not tolerate well.  Doxazosin refilled, risks, benefits, and alternatives discussed Discontinue PSA screening per AUA guidelines RTC 1 year for IPSS, PVR   Billey Co, MD  [facility] [address], [place], [place] [address] [phone]

## 2019-11-03 NOTE — Patient Instructions (Signed)

## 2020-05-03 ENCOUNTER — Other Ambulatory Visit: Payer: Self-pay | Admitting: Urology

## 2020-11-01 ENCOUNTER — Other Ambulatory Visit: Payer: Self-pay

## 2020-11-01 ENCOUNTER — Ambulatory Visit (INDEPENDENT_AMBULATORY_CARE_PROVIDER_SITE_OTHER): Payer: Medicare Other | Admitting: Urology

## 2020-11-01 ENCOUNTER — Encounter: Payer: Self-pay | Admitting: Urology

## 2020-11-01 VITALS — BP 145/72 | HR 56 | Ht 70.0 in | Wt 192.0 lb

## 2020-11-01 DIAGNOSIS — N401 Enlarged prostate with lower urinary tract symptoms: Secondary | ICD-10-CM

## 2020-11-01 DIAGNOSIS — Z125 Encounter for screening for malignant neoplasm of prostate: Secondary | ICD-10-CM

## 2020-11-01 LAB — BLADDER SCAN AMB NON-IMAGING

## 2020-11-01 MED ORDER — FINASTERIDE 5 MG PO TABS: 5.0000 mg | ORAL_TABLET | Freq: Every day | ORAL | 6 refills | Status: DC

## 2020-11-01 NOTE — Patient Instructions (Signed)
Prostate Cancer Screening Prostate cancer screening is a test that is done to check for the presence of prostate cancer in men. The prostate gland is a walnut-sized gland that is located below the bladder and in front of the rectum in males. The function of the prostate is to add fluid to semen during ejaculation. Prostate cancer is the second most common type of cancer in men. Who should have prostate cancer screening? Screening recommendations vary based on age and other risk factors. Screening is recommended if: You are older than age 78. If you are age 36-69, talk with your health care provider about your need for screening and how often screening should be done. Because most prostate cancers are slow growing and will not cause death, screening is generally reserved in this age group for men who have a 10-15-year life expectancy. You are younger than age 62, and you have these risk factors: Being a [place] male or a male of African descent. Having a father, brother, or uncle who has been diagnosed with prostate cancer. The risk is higher if your family member's cancer occurred at an early age. Screening is not recommended if: You are younger than age 16. You are between the ages of 24 and 33 and you have no risk factors. You are 34 years of age or older. At this age, the risks that screening can cause are greater than the benefits that it may provide. If you are at high risk for prostate cancer, your health care provider may recommend that you have screenings more often or that you start screening at a younger age. How is screening for prostate cancer done? The recommended prostate cancer screening test is a blood test called the prostate-specific antigen (PSA) test. PSA is a protein that is made in the prostate. As you age, your prostate naturally produces more PSA. Abnormally high PSA levels may be caused by: Prostate cancer. An enlarged prostate that is not caused by cancer (benign prostatic  hyperplasia, BPH). This condition is very common in older men. A prostate gland infection (prostatitis). Depending on the PSA results, you may need more tests, such as: A physical exam to check the size of your prostate gland. Blood and imaging tests. A procedure to remove tissue samples from your prostate gland for testing (biopsy). What are the benefits of prostate cancer screening? Screening can help to identify cancer at an early stage, before symptoms start and when the cancer can be treated more easily. There is a small chance that screening may lower your risk of dying from prostate cancer. The chance is small because prostate cancer is a slow-growing cancer, and most men with prostate cancer die from a different cause. What are the risks of prostate cancer screening? The main risk of prostate cancer screening is diagnosing and treating prostate cancer that would never have caused any symptoms or problems. This is called overdiagnosisand overtreatment. PSA screening cannot tell you if your PSA is high due to cancer or a different cause. A prostate biopsy is the only procedure to diagnose prostate cancer. Even the results of a biopsy may not tell you if your cancer needs to be treated. Slow-growing prostate cancer may not need any treatment other than monitoring, so diagnosing and treating it may cause unnecessary stress or other side effects. Questions to ask your health care provider When should I start prostate cancer screening? What is my risk for prostate cancer? How often do I need screening? What type of screening tests do  I need? How do I get my test results? What do my results mean? Do I need treatment? Where to find more information The American Cancer Society: www.cancer.org American Urological Association: www.auanet.org Contact a health care provider if: You have difficulty urinating. You have pain when you urinate or ejaculate. You have blood in your urine or semen. You  have pain in your back or in the area of your prostate. Summary Prostate cancer is a common type of cancer in men. The prostate gland is located below the bladder and in front of the rectum. This gland adds fluid to semen during ejaculation. Prostate cancer screening may identify cancer at an early stage, when the cancer can be treated more easily. The prostate-specific antigen (PSA) test is the recommended screening test for prostate cancer. Discuss the risks and benefits of prostate cancer screening with your health care provider. If you are age 69 or older, the risks that screening can cause are greater than the benefits that it may provide. This information is not intended to replace advice given to you by your health care provider. Make sure you discuss any questions you have with your health care provider. Document Revised: 04/06/2020 Document Reviewed: 09/16/2018 Elsevier Patient Education  [place].   Benign Prostatic Hyperplasia Benign prostatic hyperplasia (BPH) is an enlarged prostate gland that is caused by the normal aging process and not by cancer. The prostate is a walnut-sized gland that is involved in the production of semen. It is located in front of the rectum and below the bladder. The bladder stores urine and the urethra is the tube that carries the urine out of the body. The prostate may get bigger as a man gets older. An enlarged prostate can press on the urethra. This can make it harder to pass urine. The build-up of urine in the bladder can cause infection. Back pressure and infection may progress to bladder damage and kidney (renal) failure. What are the causes? This condition is part of a normal aging process. However, not all men develop problems from this condition. If the prostate enlarges away from the urethra, urine flow will not be blocked. If it enlarges toward the urethra and compresses it, there will be problems passing urine. What increases the  risk? This condition is more likely to develop in men over the age of 100 years. What are the signs or symptoms? Symptoms of this condition include: Getting up often during the night to urinate. Needing to urinate frequently during the day. Difficulty starting urine flow. Decrease in size and strength of your urine stream. Leaking (dribbling) after urinating. Inability to pass urine. This needs immediate treatment. Inability to completely empty your bladder. Pain when you pass urine. This is more common if there is also an infection. Urinary tract infection (UTI). How is this diagnosed? This condition is diagnosed based on your medical history, a physical exam, and your symptoms. Tests will also be done, such as: A post-void bladder scan. This measures any amount of urine that may remain in your bladder after you finish urinating. A digital rectal exam. In a rectal exam, your health care provider checks your prostate by putting a lubricated, gloved finger into your rectum to feel the back of your prostate gland. This exam detects the size of your gland and any abnormal lumps or growths. An exam of your urine (urinalysis). A prostate specific antigen (PSA) screening. This is a blood test used to screen for prostate cancer. An ultrasound. This test uses  sound waves to electronically produce a picture of your prostate gland. Your health care provider may refer you to a specialist in kidney and prostate diseases (urologist). How is this treated? Once symptoms begin, your health care provider will monitor your condition (active surveillance or watchful waiting). Treatment for this condition will depend on the severity of your condition. Treatment may include: Observation and yearly exams. This may be the only treatment needed if your condition and symptoms are mild. Medicines to relieve your symptoms, including: Medicines to shrink the prostate. Medicines to relax the muscle of the  prostate. Surgery in severe cases. Surgery may include: Prostatectomy. In this procedure, the prostate tissue is removed completely through an open incision or with a laparoscope or robotics. Transurethral resection of the prostate (TURP). In this procedure, a tool is inserted through the opening at the tip of the penis (urethra). It is used to cut away tissue of the inner core of the prostate. The pieces are removed through the same opening of the penis. This removes the blockage. Transurethral incision (TUIP). In this procedure, small cuts are made in the prostate. This lessens the prostate's pressure on the urethra. Transurethral microwave thermotherapy (TUMT). This procedure uses microwaves to create heat. The heat destroys and removes a small amount of prostate tissue. Transurethral needle ablation (TUNA). This procedure uses radio frequencies to destroy and remove a small amount of prostate tissue. Interstitial laser coagulation ([place]). This procedure uses a laser to destroy and remove a small amount of prostate tissue. Transurethral electrovaporization (TUVP). This procedure uses electrodes to destroy and remove a small amount of prostate tissue. Prostatic urethral lift. This procedure inserts an implant to push the lobes of the prostate away from the urethra. Follow these instructions at home: Take over-the-counter and prescription medicines only as told by your health care provider. Monitor your symptoms for any changes. Contact your health care provider with any changes. Avoid drinking large amounts of liquid before going to bed or out in public. Avoid or reduce how much caffeine or alcohol you drink. Give yourself time when you urinate. Keep all follow-up visits as told by your health care provider. This is important. Contact a health care provider if: You have unexplained back pain. Your symptoms do not get better with treatment. You develop side effects from the medicine you are  taking. Your urine becomes very dark or has a bad smell. Your lower abdomen becomes distended and you have trouble passing your urine. Get help right away if: You have a fever or chills. You suddenly cannot urinate. You feel lightheaded, or very dizzy, or you faint. There are large amounts of blood or clots in the urine. Your urinary problems become hard to manage. You develop moderate to severe low back or flank pain. The flank is the side of your body between the ribs and the hip. These symptoms may represent a serious problem that is an emergency. Do not wait to see if the symptoms will go away. Get medical help right away. Call your local emergency services (911 in the [place] Do not drive yourself to the hospital. Summary Benign prostatic hyperplasia (BPH) is an enlarged prostate that is caused by the normal aging process and not by cancer. An enlarged prostate can press on the urethra. This can make it hard to pass urine. This condition is part of a normal aging process and is more likely to develop in men over the age of 88 years. Get help right away if you suddenly  cannot urinate. This information is not intended to replace advice given to you by your health care provider. Make sure you discuss any questions you have with your health care provider. Document Revised: 05/16/2020 Document Reviewed: 10/13/2019 Elsevier Patient Education  [place].

## 2020-11-01 NOTE — Progress Notes (Signed)
   11/01/2020 8:22 AM   Delfino Lovett Carlean Jews Peter Garter 1945/08/06 [id number]  Reason for visit: BPH, PSA screening  HPI: 75 year old male with mild BPH symptoms on doxazosin daily long-term with good control of his symptoms.  Primary complaint in the past has been frequency.  IPSS score today is 21 with really no change in his symptoms, PVR is normal today at 18 mL.  His primary issue is frequency, rare urgency, and nocturia 1-2 times overnight.  He drinks primarily water during the day, and coffee in the morning.  He also has a history of mildly elevated PSA, with a negative prostate biopsy in 2018(did not tolerate biopsy well), and large prostate measuring 96 g on CT in 2020.  We previously reviewed the AUA guidelines that do not recommend routine screening in men over age 71, and his most likely etiology for elevated PSA is his BPH and very enlarged prostate.  Despite our recommendations, his PCP has continued to check PSA, which was stable at 4.2 in March 2022, 5.6 in August 2022, and increased to 10.4 in September 2022.  Urinalysis was benign.  I had another very long conversation with the patient today about the controversy surrounding PSA screening, and the AUA guidelines that do not recommend routine screening in men over age 40.  We also discussed that a  "normal" PSA for men in their 74s is likely less than 6.5.  DRE today 100 g, smooth, benign prostate with no nodules or masses.  He was started on finasteride by his PCP for the most recent elevated PSA of 10.4.  Now that we are in this position with a single elevated PSA of 10, and he has already been started on finasteride by his PCP, I think it is reasonable to continue the finasteride for 6 months to see if his urinary symptoms improve and if the PSA decreases.  Would not recommend repeating PSA until 6 months to give finasteride a chance to shrink the prostate and see how the PSA response.  He remains very averse to prostate biopsy, and I think if the PSA  continues to rise we could consider prostate MRI in the future.  Continue finasteride and repeat PSA with reflex to free in 6 months with me   Billey Co, MD  [facility] [address] [place], [place] [address] [phone]

## 2020-11-20 IMAGING — CT CT ABD-PELV W/ CM
2 of 5 series · 16 of 46 positions shown, 18 images · IV contrast (APPLIED)
Comparison: 11/20/2017

CLINICAL DATA: Epigastric abdominal pain.

EXAM:
CT ABDOMEN AND PELVIS WITH CONTRAST
TECHNIQUE: Multidetector CT imaging of the abdomen and pelvis was performed
using the standard protocol following bolus administration of
intravenous contrast.
CONTRAST:  100mL [id number] IOPAMIDOL ([id number]) INJECTION 61%

[Series 2: routine abd/pel with · axial · 0.86mm/px · z∈[-865,-415]mm · 13 of 102 slices shown, 15 images]
[im 6/102  soft-tissue]
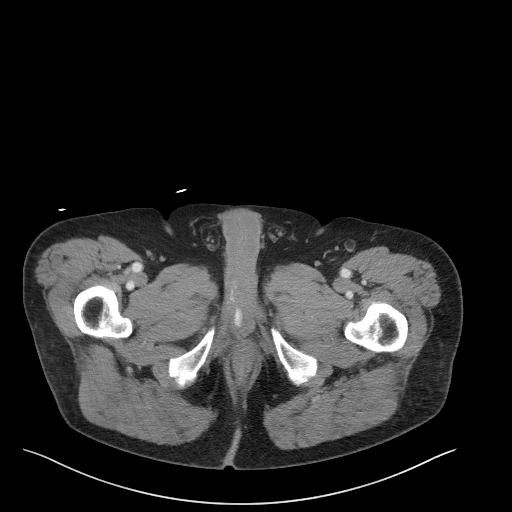
[im 6/102  bone]
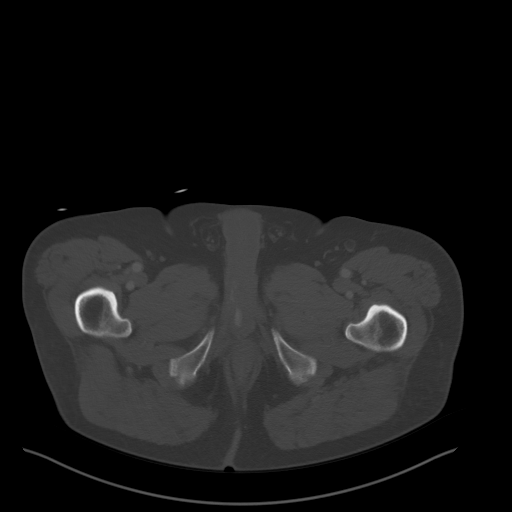
[im 16/102  soft-tissue]
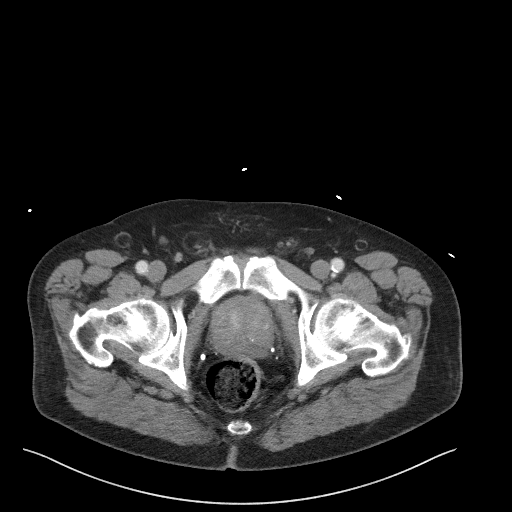
[im 22/102  soft-tissue]
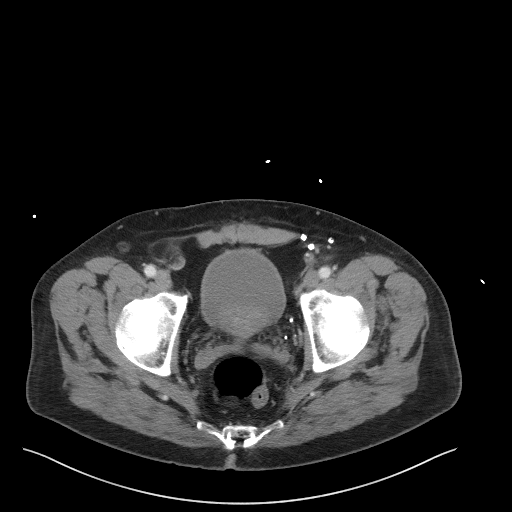
[im 27/102  soft-tissue]
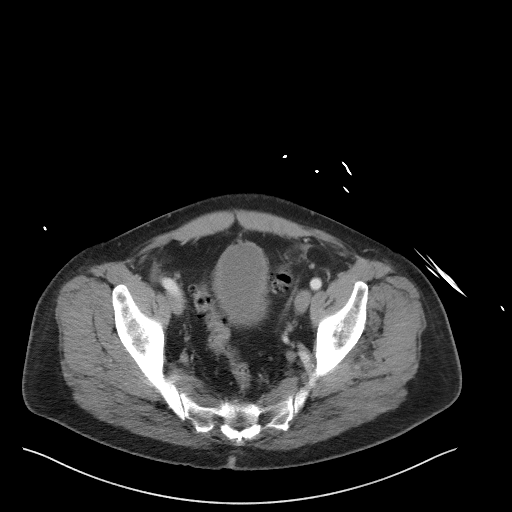
[im 38/102  soft-tissue]
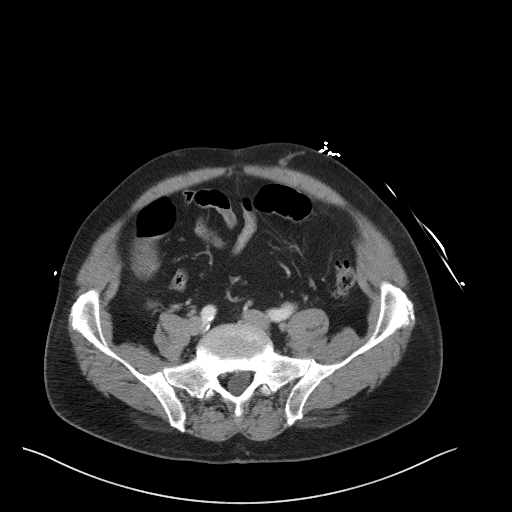
[im 43/102  soft-tissue]
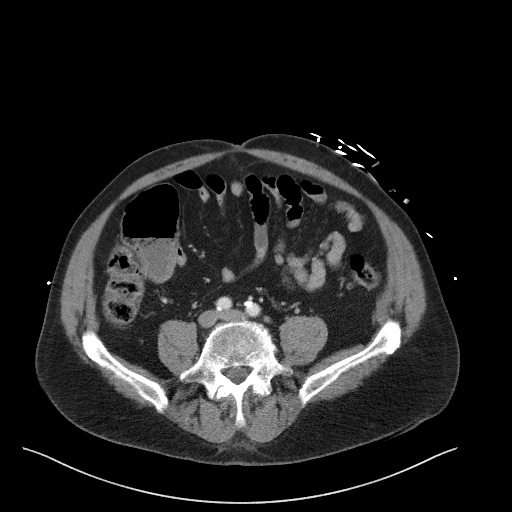
[im 54/102  soft-tissue]
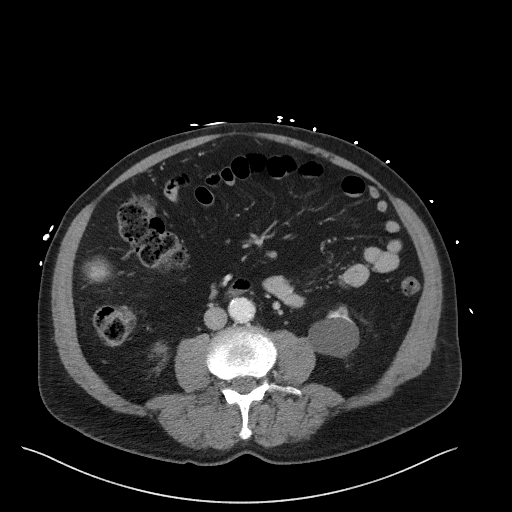
[im 59/102  soft-tissue]
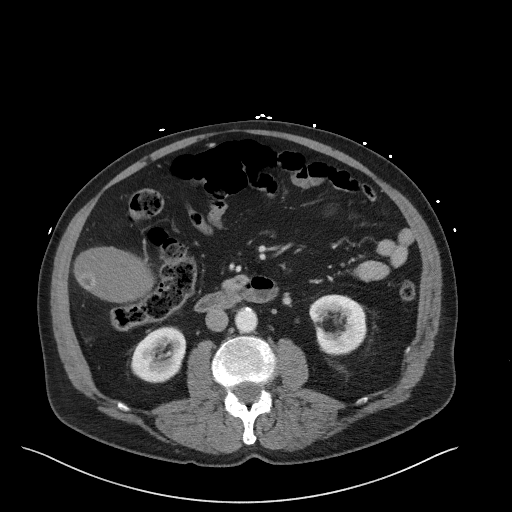
[im 64/102  soft-tissue]
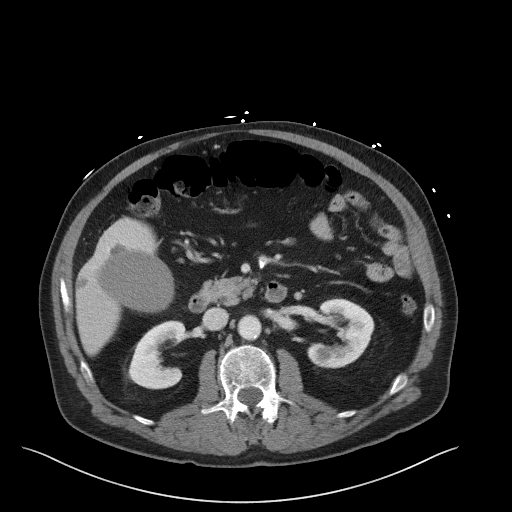
[im 64/102  bone]
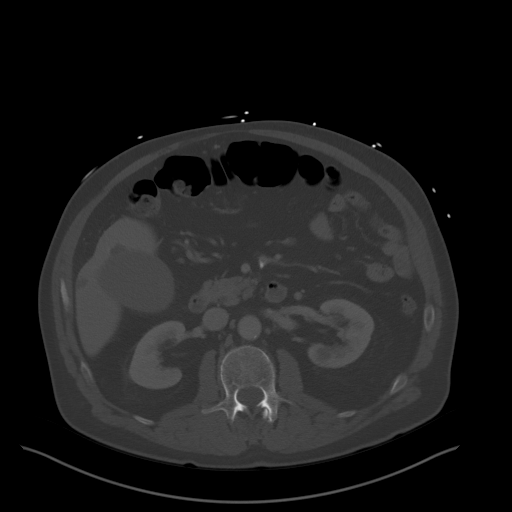
[im 75/102  soft-tissue]
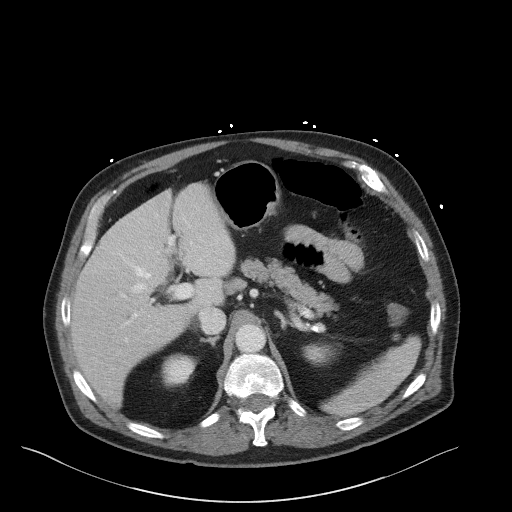
[im 80/102  soft-tissue]
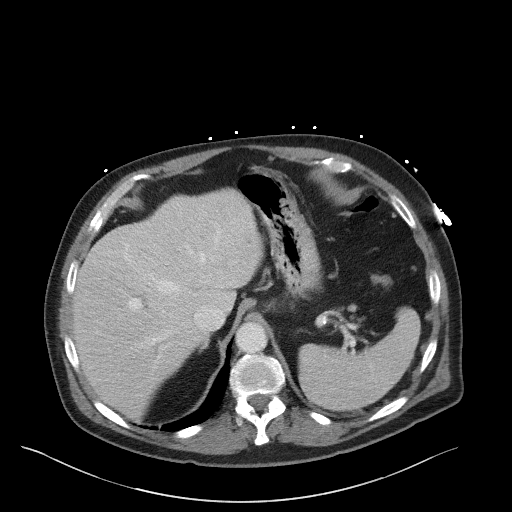
[im 86/102  soft-tissue]
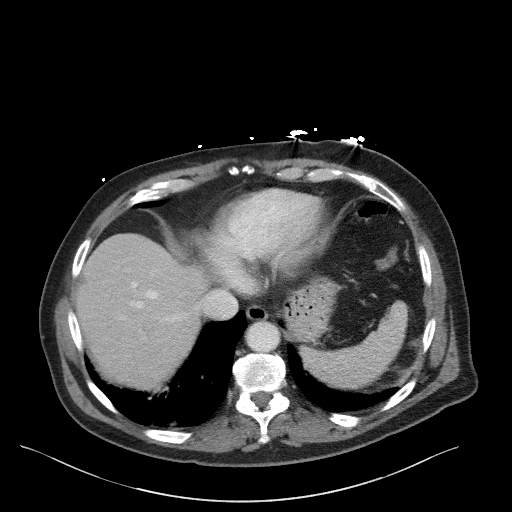
[im 96/102  soft-tissue]
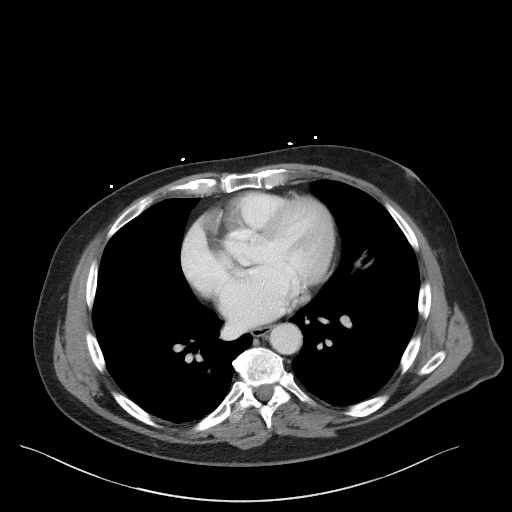

[Series 5: coronal st · coronal · 0.75mm/px · 3 of 105 slices shown]
[im 35/105  soft-tissue]
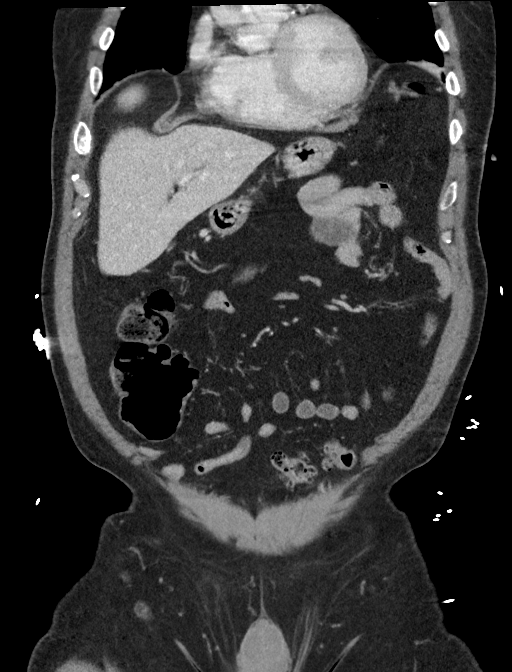
[im 47/105  soft-tissue]
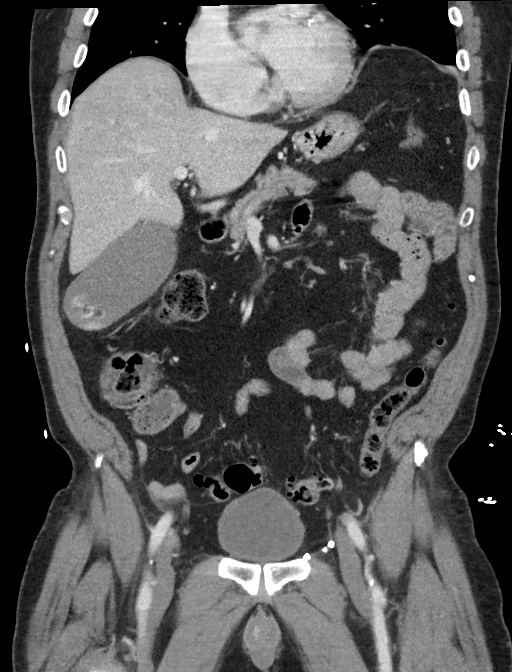
[im 58/105  soft-tissue]
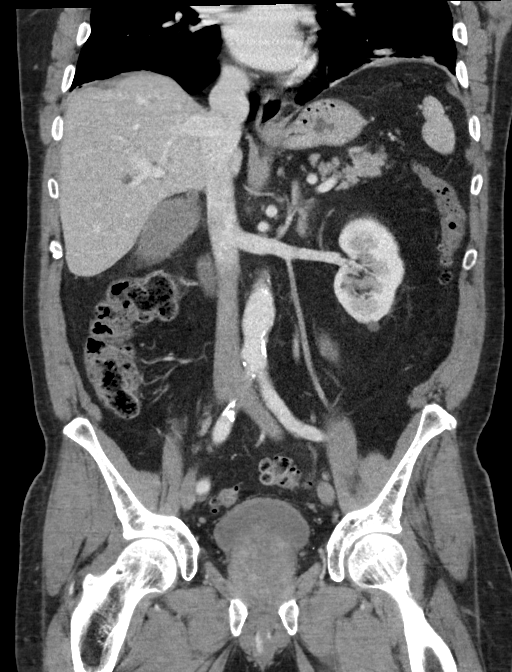

[16 of 46 positions shown; findings below may reference images not displayed]

FINDINGS: Lower chest: No acute abnormality.

Hepatobiliary: Small low density foci within both lobes of liver are
again noted. These remain similar to previous exam all measuring
less than 1 cm and the os too small to reliably characterize. Favor
multiple small cysts. Gallstones are identified which measure up to
1.4 cm.

Pancreas: Unremarkable. No pancreatic ductal dilatation or
surrounding inflammatory changes.

Spleen: Normal in size without focal abnormality.

Adrenals/Urinary Tract: Normal appearance of the adrenal glands.
Left kidney cysts are again noted. Urinary bladder is unremarkable.

Stomach/Bowel: The stomach appears normal. The small bowel loops
have a normal course and caliber without obstruction. The appendix
is visualized and appears normal extensive sigmoid diverticulosis.
No acute inflammation.

Vascular/Lymphatic: Aortic atherosclerosis. No aneurysm. No
abdominopelvic adenopathy.

Reproductive: Prostate gland is enlarged measuring 5.2 by 5.3 by
cm.

Other: No free fluid or fluid collections.

Musculoskeletal: No acute or significant osseous findings.
IMPRESSION: 1. No acute findings within the abdomen or pelvis.
2. Extensive sigmoid diverticulosis without acute inflammation.
3. Gallstones. No gallbladder wall thickening or pericholecystic
fluid. If there is a clinical concern for acute cholecystitis
consider further imaging with gallbladder sonogram.
4.  Aortic Atherosclerosis ([id number]).

## 2020-11-20 IMAGING — US US ABDOMEN LIMITED
1 series · 14 of 25 positions shown · non-contrast
Comparison: Abdomen and pelvis CT obtained earlier today.

CLINICAL DATA: Epigastric abdominal pain. Cholelithiasis on a
recent abdomen and pelvis CT.

EXAM:
ULTRASOUND ABDOMEN LIMITED RIGHT UPPER QUADRANT

[Series 1: us abdomen limited · 14 of 49 slices shown]
[im 1/49]
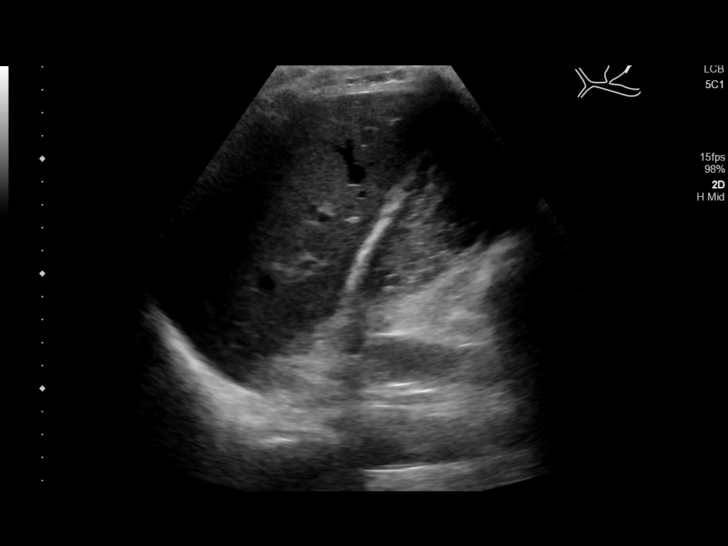
[im 5/49]
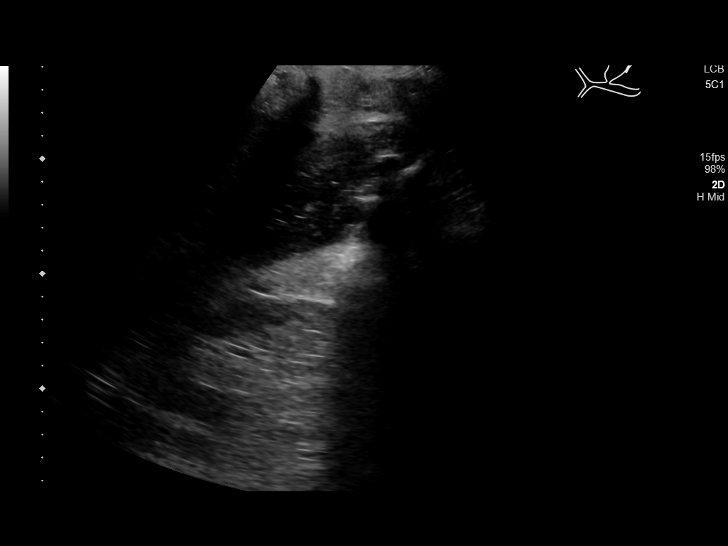
[im 9/49]
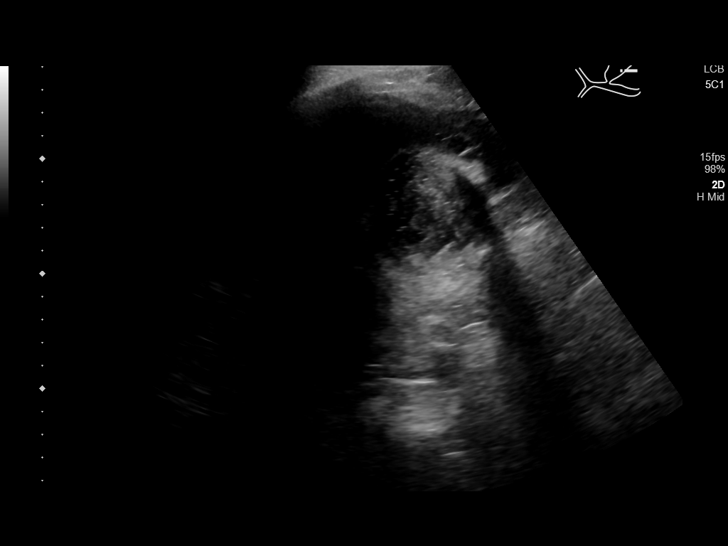
[im 13/49]
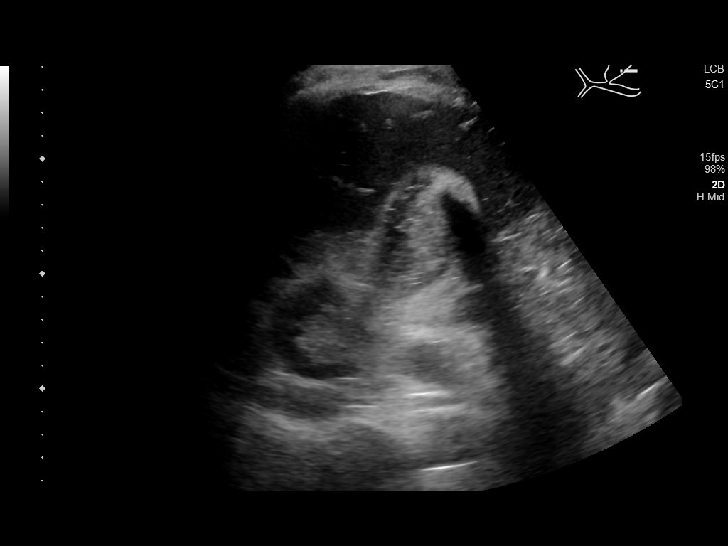
[im 17/49]
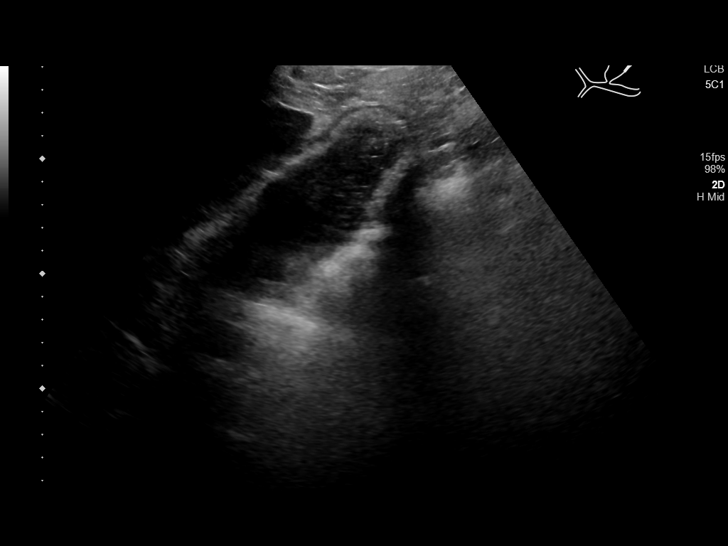
[im 19/49]
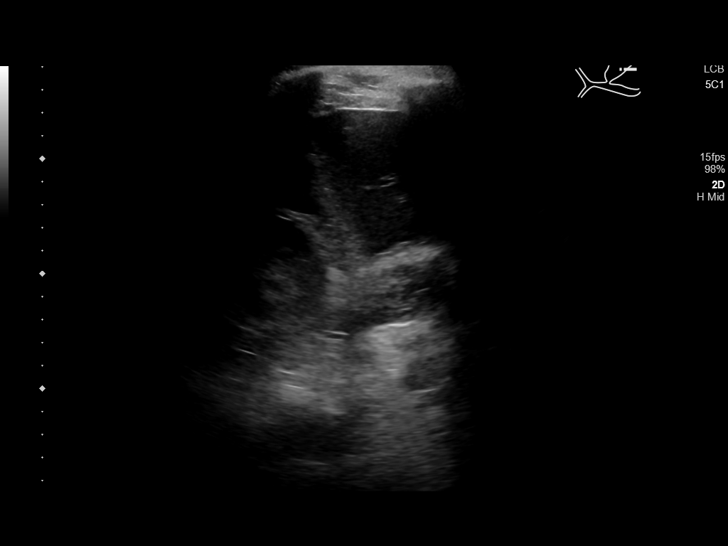
[im 23/49]
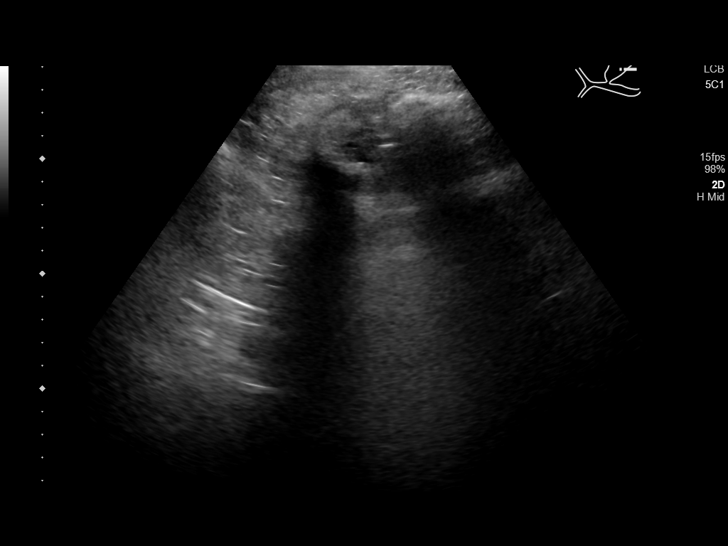
[im 27/49]
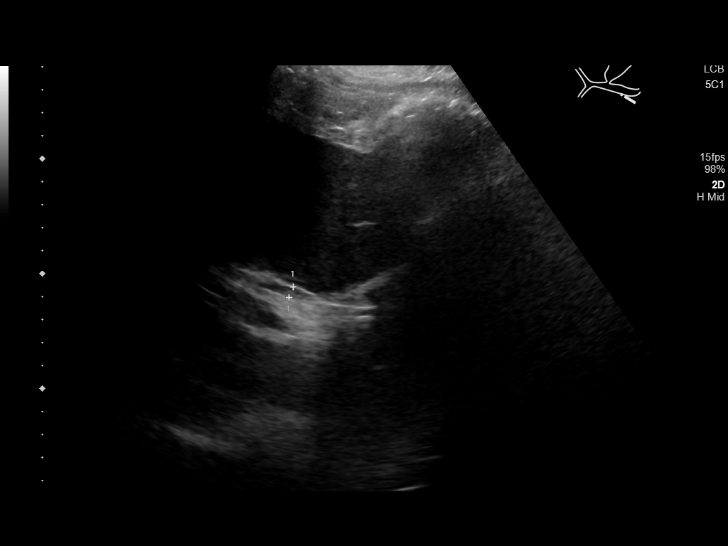
[im 31/49]
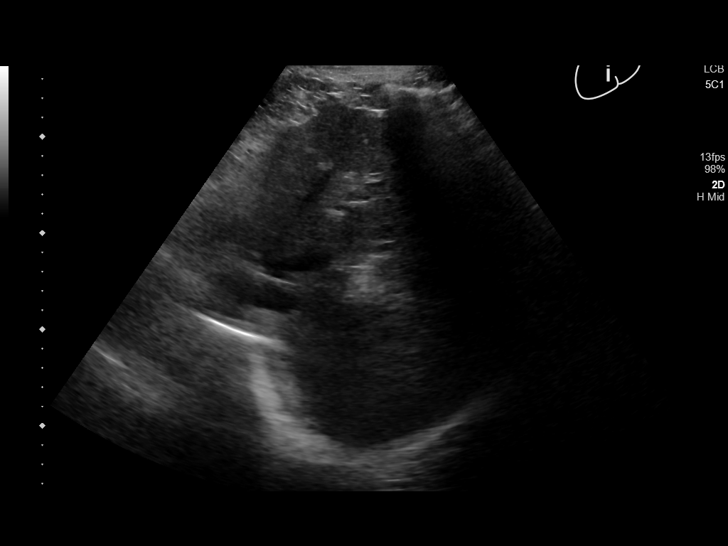
[im 33/49]
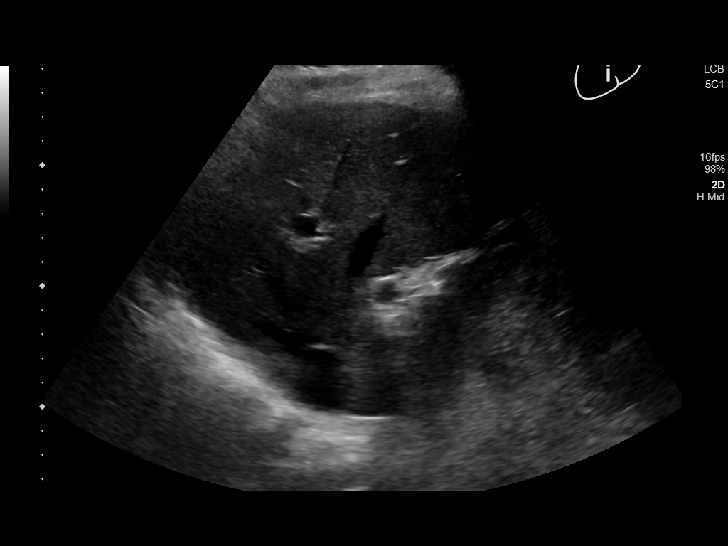
[im 37/49]
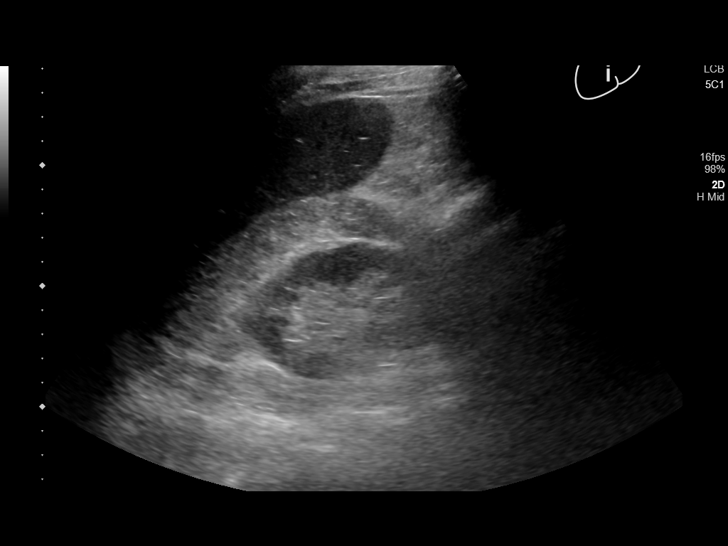
[im 41/49]
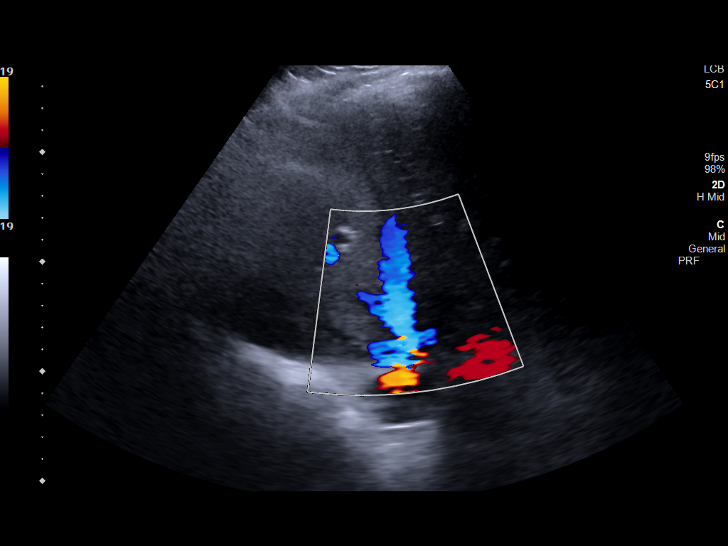
[im 45/49]
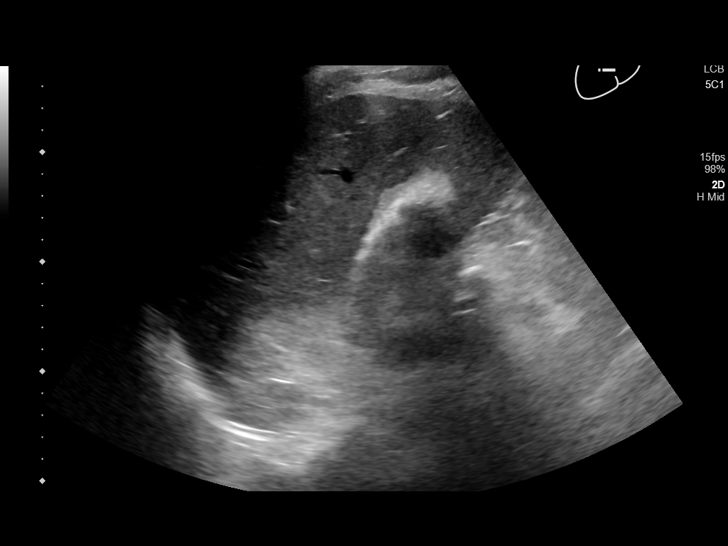
[im 49/49]
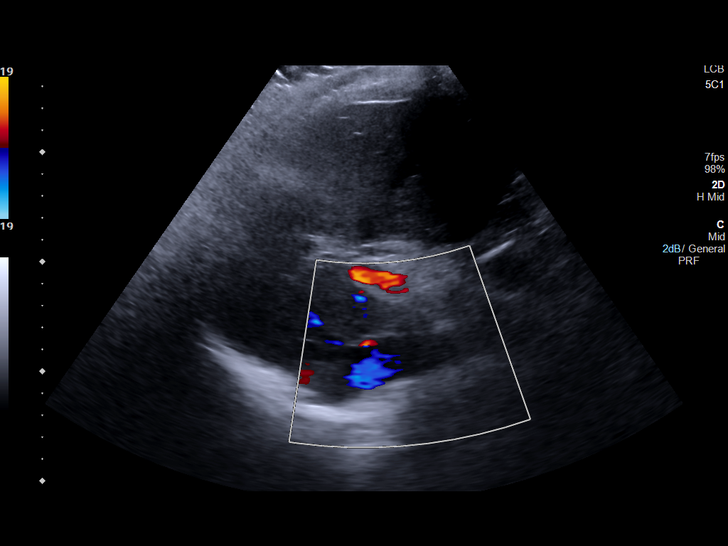

[14 of 25 positions shown; findings below may reference images not displayed]

FINDINGS: Gallbladder:

Gallstones and sludge filling the gallbladder. The largest visible
individual stone measures 2.8 cm in maximum diameter. Gallbladder
wall thickening with a maximum thickness of 5.5 mm. Small amount of
pericholecystic fluid. There was a positive sonographic Murphy sign.

Common bile duct:

Diameter: 5.0 mm

Liver:

No focal lesion identified. Within normal limits in parenchymal
echogenicity. Portal vein is patent on color Doppler imaging with
normal direction of blood flow towards the liver.
IMPRESSION: Cholelithiasis, gallbladder wall thickening, pericholecystic fluid
and positive sonographic Murphy sign. This combination of findings
is compatible with acute cholecystitis.

## 2021-04-25 ENCOUNTER — Other Ambulatory Visit: Payer: Self-pay

## 2021-04-25 DIAGNOSIS — Z125 Encounter for screening for malignant neoplasm of prostate: Secondary | ICD-10-CM

## 2021-04-26 ENCOUNTER — Other Ambulatory Visit: Payer: Medicare Other

## 2021-05-01 ENCOUNTER — Ambulatory Visit (INDEPENDENT_AMBULATORY_CARE_PROVIDER_SITE_OTHER): Payer: Medicare Other | Admitting: Urology

## 2021-05-01 ENCOUNTER — Encounter: Payer: Self-pay | Admitting: Urology

## 2021-05-01 ENCOUNTER — Other Ambulatory Visit: Payer: Self-pay

## 2021-05-01 VITALS — BP 150/77 | HR 51 | Ht 70.0 in | Wt 197.0 lb

## 2021-05-01 DIAGNOSIS — Z125 Encounter for screening for malignant neoplasm of prostate: Secondary | ICD-10-CM | POA: Diagnosis not present

## 2021-05-01 DIAGNOSIS — N401 Enlarged prostate with lower urinary tract symptoms: Secondary | ICD-10-CM | POA: Diagnosis not present

## 2021-05-01 DIAGNOSIS — N138 Other obstructive and reflux uropathy: Secondary | ICD-10-CM | POA: Diagnosis not present

## 2021-05-01 MED ORDER — FINASTERIDE 5 MG PO TABS: 5.0000 mg | ORAL_TABLET | Freq: Every day | ORAL | 11 refills | Status: DC

## 2021-05-01 NOTE — Progress Notes (Signed)
? ?  05/01/2021 ?8:57 AM  ? ?Justin Torres ?Jun 06, 1945 ?[id number] ? ?Reason for visit: BPH, PSA screening/elevated PSA ? ?HPI: ?76 year old male with mild BPH symptoms on doxazosin daily long-term with good control of his symptoms.  Primary complaint in the past has been frequency.  PVRs have been normal less than 50 mL in the past..  His primary issue is frequency, rare urgency, and nocturia 1-2 times overnight.  He drinks primarily water during the day, and coffee in the morning. ? ?He also has a history of mildly elevated PSA, with a negative prostate biopsy in 2018(did not tolerate biopsy well), and large prostate measuring 96 g on CT in 2020.  We previously reviewed the AUA guidelines that do not recommend routine screening in men over age 12, and his most likely etiology for elevated PSA is his BPH and very enlarged prostate.  Despite our recommendations, his PCP continue to check PCA which increased to 10.4 in September 2022, and he was started on finasteride by PCP at that time.  At our last visit DRE was benign and showed a 100 g prostate.  Using shared decision making, he opted to continue the finasteride to see if it would improve his urinary symptoms, and decrease the PSA as expected if this was elevated secondary to BPH. ? ?He has had an excellent PSA response on the finasteride, down to 1.93, indicating BPH as the most likely etiology of his elevated PSA.  Okay to discontinue PSA screening.  He reports some mild improvement in the urinary symptoms as well.  Overall he satisfied with his urinary symptoms.  We briefly discussed outlet procedure like HOLEP in the setting of his 100 g+ prostate, but he is not bothered enough by symptoms at this time to consider surgery, and he would like to continue maximal medical therapy at this time.  ? ?-Finasteride refilled, continue doxazosin ?-Discontinue PSA screening per guideline recommendations-prior elevated PSA likely secondary to significant BPH with 100+  grams prostate ?-RTC 1 year IPSS/PVR ? ? ?Billey Co, MD ? ?[facility] ?[address] ?[place], [place] [address] ?([phone] ? ? ?

## 2021-05-01 NOTE — Patient Instructions (Addendum)
Good news, your PSA came down to normal on the finasteride.  No further PSA screening needed ? ?Recommend continuing the doxazosin and finasteride for your enlarged prostate and urinary symptoms ?

## 2021-07-02 ENCOUNTER — Ambulatory Visit (INDEPENDENT_AMBULATORY_CARE_PROVIDER_SITE_OTHER): Payer: Medicare Other | Admitting: Dermatology

## 2021-07-02 DIAGNOSIS — L578 Other skin changes due to chronic exposure to nonionizing radiation: Secondary | ICD-10-CM

## 2021-07-02 DIAGNOSIS — L821 Other seborrheic keratosis: Secondary | ICD-10-CM | POA: Diagnosis not present

## 2021-07-02 DIAGNOSIS — Z1283 Encounter for screening for malignant neoplasm of skin: Secondary | ICD-10-CM | POA: Diagnosis not present

## 2021-07-02 DIAGNOSIS — L814 Other melanin hyperpigmentation: Secondary | ICD-10-CM

## 2021-07-02 DIAGNOSIS — L820 Inflamed seborrheic keratosis: Secondary | ICD-10-CM

## 2021-07-02 DIAGNOSIS — D229 Melanocytic nevi, unspecified: Secondary | ICD-10-CM

## 2021-07-02 DIAGNOSIS — D1800 Hemangioma unspecified site: Secondary | ICD-10-CM

## 2021-07-02 DIAGNOSIS — L720 Epidermal cyst: Secondary | ICD-10-CM

## 2021-07-02 NOTE — Patient Instructions (Addendum)

## 2021-07-02 NOTE — Progress Notes (Addendum)
New Patient Visit  Subjective  Justin Torres is a 76 y.o. male who presents for the following: check spots (Scalp, arms, some itchy) and Upper body skin exam (No hx of skin ca). The patient presents for Upper Body Skin Exam (UBSE) for skin cancer screening and mole check.  The patient has spots, moles and lesions to be evaluated, some may be new or changing and the patient has concerns that these could be cancer.   The following portions of the chart were reviewed this encounter and updated as appropriate:   Tobacco  Allergies  Meds  Problems  Med Hx  Surg Hx  Fam Hx     Review of Systems:  No other skin or systemic complaints except as noted in HPI or Assessment and Plan.  Objective  Well appearing patient in no apparent distress; mood and affect are within normal limits.  All skin waist up examined.  R forehead Cystic pap  R medial scapula Stuck on waxy paps with erythema   Assessment & Plan   Lentigines - Scattered tan macules - Due to sun exposure - Benign-appearing, observe - Recommend daily broad spectrum sunscreen SPF 30+ to sun-exposed areas, reapply every 2 hours as needed. - Call for any changes  Seborrheic Keratoses - Stuck-on, waxy, tan-brown papules and/or plaques  - Benign-appearing - Discussed benign etiology and prognosis. - Observe - Call for any changes  Melanocytic Nevi - Tan-brown and/or pink-flesh-colored symmetric macules and papules - Benign appearing on exam today - Observation - Call clinic for new or changing moles - Recommend daily use of broad spectrum spf 30+ sunscreen to sun-exposed areas.   Hemangiomas - Red papules - Discussed benign nature - Observe - Call for any changes  Actinic Damage - Chronic condition, secondary to cumulative UV/sun exposure - diffuse scaly erythematous macules with underlying dyspigmentation - Recommend daily broad spectrum sunscreen SPF 30+ to sun-exposed areas, reapply every 2 hours as  needed.  - Staying in the shade or wearing long sleeves, sun glasses (UVA+UVB protection) and wide brim hats (4-inch brim around the entire circumference of the hat) are also recommended for sun protection.  - Call for new or changing lesions.  Skin cancer screening performed today.  Epidermal cyst R forehead Benign, observe.    Inflamed seborrheic keratosis R medial scapula Vs Irritated Nevus, recheck on f/u  Destruction of lesion - R medial scapula Complexity: simple   Destruction method: cryotherapy   Informed consent: discussed and consent obtained   Timeout:  patient name, date of birth, surgical site, and procedure verified Lesion destroyed using liquid nitrogen: Yes   Region frozen until ice ball extended beyond lesion: Yes   Outcome: patient tolerated procedure well with no complications   Post-procedure details: wound care instructions given    Seborrheic keratosis, inflamed (3) R lat tricep x 1, R mid vertex scalp x 1, R medial cheek x 1  Recheck on f/u  Destruction of lesion - R lat tricep x 1, R mid vertex scalp x 1, R medial cheek x 1 Complexity: simple   Destruction method: cryotherapy   Informed consent: discussed and consent obtained   Timeout:  patient name, date of birth, surgical site, and procedure verified Lesion destroyed using liquid nitrogen: Yes   Region frozen until ice ball extended beyond lesion: Yes   Outcome: patient tolerated procedure well with no complications   Post-procedure details: wound care instructions given    Return in about 5 weeks (around 08/06/2021) for  recheck ISKs R medial scapula, R lat tricep, R mid vertex scalp, R medial cheek.  I, Othelia Pulling, RMA, am acting as scribe for Sarina Ser, MD . Documentation: I have reviewed the above documentation for accuracy and completeness, and I agree with the above.  Sarina Ser, MD

## 2021-07-13 ENCOUNTER — Encounter: Payer: Self-pay | Admitting: Dermatology

## 2021-07-13 NOTE — Addendum Note (Signed)
Addended by: Ralene Bathe on: 07/13/2021 06:48 PM   Modules accepted: Level of Service

## 2021-08-07 ENCOUNTER — Ambulatory Visit (INDEPENDENT_AMBULATORY_CARE_PROVIDER_SITE_OTHER): Payer: Medicare Other | Admitting: Dermatology

## 2021-08-07 DIAGNOSIS — L578 Other skin changes due to chronic exposure to nonionizing radiation: Secondary | ICD-10-CM

## 2021-08-07 DIAGNOSIS — L820 Inflamed seborrheic keratosis: Secondary | ICD-10-CM

## 2021-08-07 DIAGNOSIS — L720 Epidermal cyst: Secondary | ICD-10-CM

## 2021-08-07 DIAGNOSIS — L570 Actinic keratosis: Secondary | ICD-10-CM

## 2021-08-07 NOTE — Patient Instructions (Signed)
Cryotherapy Aftercare  Wash gently with soap and water everyday.   Apply Vaseline and Band-Aid daily until healed.     Due to recent changes in healthcare laws, you may see results of your pathology and/or laboratory studies on MyChart before the doctors have had a chance to review them. We understand that in some cases there may be results that are confusing or concerning to you. Please understand that not all results are received at the same time and often the doctors may need to interpret multiple results in order to provide you with the best plan of care or course of treatment. Therefore, we ask that you please give us 2 business days to thoroughly review all your results before contacting the office for clarification. Should we see a critical lab result, you will be contacted sooner.   If You Need Anything After Your Visit  If you have any questions or concerns for your doctor, please call our main line at [phone] and press option 4 to reach your doctor's medical assistant. If no one answers, please leave a voicemail as directed and we will return your call as soon as possible. Messages left after 4 pm will be answered the following business day.   You may also send us a message via MyChart. We typically respond to MyChart messages within 1-2 business days.  For prescription refills, please ask your pharmacy to contact our office. Our fax number is [phone].  If you have an urgent issue when the clinic is closed that cannot wait until the next business day, you can page your doctor at the number below.    Please note that while we do our best to be available for urgent issues outside of office hours, we are not available 24/7.   If you have an urgent issue and are unable to reach us, you may choose to seek medical care at your doctor's office, retail clinic, urgent care center, or emergency room.  If you have a medical emergency, please immediately call 911 or go to the  emergency department.  Pager Numbers  - Dr. Kowalski: [phone]  - Dr. Moye: [phone]  - Dr. Stewart: [phone]  In the event of inclement weather, please call our main line at [phone] for an update on the status of any delays or closures.  Dermatology Medication Tips: Please keep the boxes that topical medications come in in order to help keep track of the instructions about where and how to use these. Pharmacies typically print the medication instructions only on the boxes and not directly on the medication tubes.   If your medication is too expensive, please contact our office at [phone] option 4 or send us a message through MyChart.   We are unable to tell what your co-pay for medications will be in advance as this is different depending on your insurance coverage. However, we may be able to find a substitute medication at lower cost or fill out paperwork to get insurance to cover a needed medication.   If a prior authorization is required to get your medication covered by your insurance company, please allow us 1-2 business days to complete this process.  Drug prices often vary depending on where the prescription is filled and some pharmacies may offer cheaper prices.  The website www.goodrx.com contains coupons for medications through different pharmacies. The prices here do not account for what the cost may be with help from insurance (it may be cheaper with your insurance), but the website can   give you the price if you did not use any insurance.  - You can print the associated coupon and take it with your prescription to the pharmacy.  - You may also stop by our office during regular business hours and pick up a GoodRx coupon card.  - If you need your prescription sent electronically to a different pharmacy, notify our office through [facility] MyChart or by phone at [phone] option 4.     Si Usted Necesita Algo Despus de Su Visita  Tambin puede  enviarnos un mensaje a travs de MyChart. Por lo general respondemos a los mensajes de MyChart en el transcurso de 1 a 2 das hbiles.  Para renovar recetas, por favor pida a su farmacia que se ponga en contacto con nuestra oficina. Nuestro nmero de fax es el [phone].  Si tiene un asunto urgente cuando la clnica est cerrada y que no puede esperar hasta el siguiente da hbil, puede llamar/localizar a su doctor(a) al nmero que aparece a continuacin.   Por favor, tenga en cuenta que aunque hacemos todo lo posible para estar disponibles para asuntos urgentes fuera del horario de oficina, no estamos disponibles las 24 horas del da, los 7 das de la semana.   Si tiene un problema urgente y no puede comunicarse con nosotros, puede optar por buscar atencin mdica  en el consultorio de su doctor(a), en una clnica privada, en un centro de atencin urgente o en una sala de emergencias.  Si tiene una emergencia mdica, por favor llame inmediatamente al 911 o vaya a la sala de emergencias.  Nmeros de bper  - Dr. Kowalski: [phone]  - Dra. Moye: [phone]  - Dra. Stewart: [phone]  En caso de inclemencias del tiempo, por favor llame a nuestra lnea principal al [phone] para una actualizacin sobre el estado de cualquier retraso o cierre.  Consejos para la medicacin en dermatologa: Por favor, guarde las cajas en las que vienen los medicamentos de uso tpico para ayudarle a seguir las instrucciones sobre dnde y cmo usarlos. Las farmacias generalmente imprimen las instrucciones del medicamento slo en las cajas y no directamente en los tubos del medicamento.   Si su medicamento es muy caro, por favor, pngase en contacto con nuestra oficina llamando al [phone] y presione la opcin 4 o envenos un mensaje a travs de MyChart.   No podemos decirle cul ser su copago por los medicamentos por adelantado ya que esto es diferente dependiendo de la cobertura de su seguro.  Sin embargo, es posible que podamos encontrar un medicamento sustituto a menor costo o llenar un formulario para que el seguro cubra el medicamento que se considera necesario.   Si se requiere una autorizacin previa para que su compaa de seguros cubra su medicamento, por favor permtanos de 1 a 2 das hbiles para completar este proceso.  Los precios de los medicamentos varan con frecuencia dependiendo del lugar de dnde se surte la receta y alguna farmacias pueden ofrecer precios ms baratos.  El sitio web www.goodrx.com tiene cupones para medicamentos de diferentes farmacias. Los precios aqu no tienen en cuenta lo que podra costar con la ayuda del seguro (puede ser ms barato con su seguro), pero el sitio web puede darle el precio si no utiliz ningn seguro.  - Puede imprimir el cupn correspondiente y llevarlo con su receta a la farmacia.  - Tambin puede pasar por nuestra oficina durante el horario de atencin regular y recoger una tarjeta de cupones de GoodRx.  -   Si necesita que su receta se enve electrnicamente a una farmacia diferente, informe a nuestra oficina a travs de MyChart de [facility] o por telfono llamando al [phone] y presione la opcin 4.  

## 2021-08-07 NOTE — Progress Notes (Signed)
   Follow-Up Visit   Subjective  Justin Torres is a 76 y.o. male who presents for the following: Follow-up (ISK follow up of right medial cheek, right mid vertex scalp, right lat tricep, right medial scapula treated with LN2). The patient has spots, moles and lesions to be evaluated, some may be new or changing and the patient has concerns that these could be cancer.  The following portions of the chart were reviewed this encounter and updated as appropriate:   Tobacco  Allergies  Meds  Problems  Med Hx  Surg Hx  Fam Hx     Review of Systems:  No other skin or systemic complaints except as noted in HPI or Assessment and Plan.  Objective  Well appearing patient in no apparent distress; mood and affect are within normal limits.  A focused examination was performed including face, scalp, arms, back. Relevant physical exam findings are noted in the Assessment and Plan.  Right tricep x 2,right low back x 1 (3) Erythematous stuck-on, waxy papule or plaque  Right Forehead 0.4 cm Subcutaneous nodule.   Forehead (6) Erythematous thin papules/macules with gritty scale.    Assessment & Plan   Actinic Damage - chronic, secondary to cumulative UV radiation exposure/sun exposure over time - diffuse scaly erythematous macules with underlying dyspigmentation - Recommend daily broad spectrum sunscreen SPF 30+ to sun-exposed areas, reapply every 2 hours as needed.  - Recommend staying in the shade or wearing long sleeves, sun glasses (UVA+UVB protection) and wide brim hats (4-inch brim around the entire circumference of the hat). - Call for new or changing lesions.  Inflamed seborrheic keratosis (3) Right tricep x 2,right low back x 1  Destruction of lesion - Right tricep x 2,right low back x 1 Complexity: simple   Destruction method: cryotherapy   Informed consent: discussed and consent obtained   Timeout:  patient name, date of birth, surgical site, and procedure verified Lesion  destroyed using liquid nitrogen: Yes   Region frozen until ice ball extended beyond lesion: Yes   Outcome: patient tolerated procedure well with no complications   Post-procedure details: wound care instructions given    Epidermal inclusion cyst Right Forehead Benign-appearing. Exam most consistent with an epidermal inclusion cyst. Discussed that a cyst is a benign growth that can grow over time and sometimes get irritated or inflamed. Recommend observation if it is not bothersome. Discussed option of surgical excision to remove it if it is growing, symptomatic, or other changes noted. Please call for new or changing lesions so they can be evaluated.  AK (actinic keratosis) (6) Forehead Destruction of lesion - Forehead Complexity: simple   Destruction method: cryotherapy   Informed consent: discussed and consent obtained   Timeout:  patient name, date of birth, surgical site, and procedure verified Lesion destroyed using liquid nitrogen: Yes   Region frozen until ice ball extended beyond lesion: Yes   Outcome: patient tolerated procedure well with no complications   Post-procedure details: wound care instructions given    Return in about 1 year (around 08/08/2022).  I, Joanie Coddington, CMA, am acting as scribe for Armida Sans, MD . Documentation: I have reviewed the above documentation for accuracy and completeness, and I agree with the above.  Armida Sans, MD

## 2021-08-09 ENCOUNTER — Encounter: Payer: Self-pay | Admitting: Dermatology

## 2022-05-07 ENCOUNTER — Ambulatory Visit: Payer: Medicare Other | Admitting: Urology

## 2022-05-14 ENCOUNTER — Encounter: Payer: Self-pay | Admitting: Urology

## 2022-05-14 ENCOUNTER — Ambulatory Visit (INDEPENDENT_AMBULATORY_CARE_PROVIDER_SITE_OTHER): Payer: Medicare Other | Admitting: Urology

## 2022-05-14 VITALS — BP 157/75 | HR 57 | Ht 68.0 in | Wt 197.0 lb

## 2022-05-14 DIAGNOSIS — Z125 Encounter for screening for malignant neoplasm of prostate: Secondary | ICD-10-CM

## 2022-05-14 DIAGNOSIS — N401 Enlarged prostate with lower urinary tract symptoms: Secondary | ICD-10-CM

## 2022-05-14 LAB — BLADDER SCAN AMB NON-IMAGING

## 2022-05-14 MED ORDER — FINASTERIDE 5 MG PO TABS: 5.0000 mg | ORAL_TABLET | Freq: Every day | ORAL | 3 refills | Status: DC

## 2022-05-14 MED ORDER — DOXAZOSIN MESYLATE 4 MG PO TABS: 4.0000 mg | ORAL_TABLET | Freq: Every day | ORAL | 3 refills | Status: DC

## 2022-05-14 NOTE — Patient Instructions (Signed)

## 2022-05-14 NOTE — Progress Notes (Signed)
   05/14/2022 9:54 AM   Justin Torres [birth year] [id number]  Reason for visit: BPH, PSA screening  HPI: 77 year old male with mild BPH symptoms on doxazosin daily long-term with good control of his symptoms.  PVRs have been normal, including 16ml today.  His primary issue is frequency every 1-2 hours during the day.  He also has a history of mildly elevated PSA, with a negative prostate biopsy in 2018(did not tolerate biopsy well), and large prostate measuring 96 g on CT in 2020.  We previously reviewed the AUA guidelines that do not recommend routine screening in men over age 48, and his most likely etiology for elevated PSA is his BPH and very enlarged prostate.  Despite our recommendations, his PCP continue to check PCA which increased to 10.4 in September 2022, and he was started on finasteride by PCP at that time.  DRE has been benign.  Using shared decision making, he opted to continue the finasteride to see if it would improve his urinary symptoms, and decrease the PSA as expected if this was elevated secondary to BPH.  PSA has decreased appropriately on finasteride, most recently 2.1 from March 2024, which is well within the normal range for his age and BPH.  We again reviewed AUA guidelines regarding PSA screening, and I would not recommend continuing screening with his known BPH, negative prostate biopsy, and age of 32.  Overall he continues to do well.  Nocturia has improved to 0-1 time overnight.  No incontinence or gross hematuria.  He would like to continue the finasteride and doxazosin.  We discussed outlet procedures like HOLEP, but he is not bothered enough to consider intervention at this time.  Return precautions were discussed.  -Finasteride and doxazosin refilled -Discontinue PSA screening per guideline recommendations-prior elevated PSA likely secondary to significant BPH with 100+ gram prostate -RTC 1 year PVR   Billey Co, MD  [facility] [address], [place], [place] [address] [phone]

## 2022-08-13 ENCOUNTER — Ambulatory Visit (INDEPENDENT_AMBULATORY_CARE_PROVIDER_SITE_OTHER): Payer: Medicare Other | Admitting: Dermatology

## 2022-08-13 ENCOUNTER — Encounter: Payer: Self-pay | Admitting: Dermatology

## 2022-08-13 VITALS — BP 154/76

## 2022-08-13 DIAGNOSIS — L820 Inflamed seborrheic keratosis: Secondary | ICD-10-CM | POA: Diagnosis not present

## 2022-08-13 DIAGNOSIS — L578 Other skin changes due to chronic exposure to nonionizing radiation: Secondary | ICD-10-CM

## 2022-08-13 DIAGNOSIS — L570 Actinic keratosis: Secondary | ICD-10-CM

## 2022-08-13 DIAGNOSIS — D229 Melanocytic nevi, unspecified: Secondary | ICD-10-CM

## 2022-08-13 DIAGNOSIS — L720 Epidermal cyst: Secondary | ICD-10-CM

## 2022-08-13 DIAGNOSIS — L729 Follicular cyst of the skin and subcutaneous tissue, unspecified: Secondary | ICD-10-CM

## 2022-08-13 DIAGNOSIS — L821 Other seborrheic keratosis: Secondary | ICD-10-CM

## 2022-08-13 DIAGNOSIS — Z1283 Encounter for screening for malignant neoplasm of skin: Secondary | ICD-10-CM

## 2022-08-13 DIAGNOSIS — W908XXA Exposure to other nonionizing radiation, initial encounter: Secondary | ICD-10-CM | POA: Diagnosis not present

## 2022-08-13 DIAGNOSIS — L814 Other melanin hyperpigmentation: Secondary | ICD-10-CM

## 2022-08-13 DIAGNOSIS — Z872 Personal history of diseases of the skin and subcutaneous tissue: Secondary | ICD-10-CM

## 2022-08-13 DIAGNOSIS — D1801 Hemangioma of skin and subcutaneous tissue: Secondary | ICD-10-CM

## 2022-08-13 NOTE — Patient Instructions (Addendum)
Cryotherapy Aftercare  Wash gently with soap and water everyday.   Apply Vaseline and Band-Aid daily until healed.     Due to recent changes in healthcare laws, you may see results of your pathology and/or laboratory studies on MyChart before the doctors have had a chance to review them. We understand that in some cases there may be results that are confusing or concerning to you. Please understand that not all results are received at the same time and often the doctors may need to interpret multiple results in order to provide you with the best plan of care or course of treatment. Therefore, we ask that you please give us 2 business days to thoroughly review all your results before contacting the office for clarification. Should we see a critical lab result, you will be contacted sooner.   If You Need Anything After Your Visit  If you have any questions or concerns for your doctor, please call our main line at [phone] and press option 4 to reach your doctor's medical assistant. If no one answers, please leave a voicemail as directed and we will return your call as soon as possible. Messages left after 4 pm will be answered the following business day.   You may also send us a message via MyChart. We typically respond to MyChart messages within 1-2 business days.  For prescription refills, please ask your pharmacy to contact our office. Our fax number is [phone].  If you have an urgent issue when the clinic is closed that cannot wait until the next business day, you can page your doctor at the number below.    Please note that while we do our best to be available for urgent issues outside of office hours, we are not available 24/7.   If you have an urgent issue and are unable to reach us, you may choose to seek medical care at your doctor's office, retail clinic, urgent care center, or emergency room.  If you have a medical emergency, please immediately call 911 or go to the  emergency department.  Pager Numbers  - Dr. Kowalski: [phone]  - Dr. Moye: [phone]  - Dr. Stewart: [phone]  In the event of inclement weather, please call our main line at [phone] for an update on the status of any delays or closures.  Dermatology Medication Tips: Please keep the boxes that topical medications come in in order to help keep track of the instructions about where and how to use these. Pharmacies typically print the medication instructions only on the boxes and not directly on the medication tubes.   If your medication is too expensive, please contact our office at [phone] option 4 or send us a message through MyChart.   We are unable to tell what your co-pay for medications will be in advance as this is different depending on your insurance coverage. However, we may be able to find a substitute medication at lower cost or fill out paperwork to get insurance to cover a needed medication.   If a prior authorization is required to get your medication covered by your insurance company, please allow us 1-2 business days to complete this process.  Drug prices often vary depending on where the prescription is filled and some pharmacies may offer cheaper prices.  The website www.goodrx.com contains coupons for medications through different pharmacies. The prices here do not account for what the cost may be with help from insurance (it may be cheaper with your insurance), but the website can   give you the price if you did not use any insurance.  - You can print the associated coupon and take it with your prescription to the pharmacy.  - You may also stop by our office during regular business hours and pick up a GoodRx coupon card.  - If you need your prescription sent electronically to a different pharmacy, notify our office through [facility] MyChart or by phone at [phone] option 4.     Si Usted Necesita Algo Despus de Su Visita  Tambin puede  enviarnos un mensaje a travs de MyChart. Por lo general respondemos a los mensajes de MyChart en el transcurso de 1 a 2 das hbiles.  Para renovar recetas, por favor pida a su farmacia que se ponga en contacto con nuestra oficina. Nuestro nmero de fax es el [phone].  Si tiene un asunto urgente cuando la clnica est cerrada y que no puede esperar hasta el siguiente da hbil, puede llamar/localizar a su doctor(a) al nmero que aparece a continuacin.   Por favor, tenga en cuenta que aunque hacemos todo lo posible para estar disponibles para asuntos urgentes fuera del horario de oficina, no estamos disponibles las 24 horas del da, los 7 das de la semana.   Si tiene un problema urgente y no puede comunicarse con nosotros, puede optar por buscar atencin mdica  en el consultorio de su doctor(a), en una clnica privada, en un centro de atencin urgente o en una sala de emergencias.  Si tiene una emergencia mdica, por favor llame inmediatamente al 911 o vaya a la sala de emergencias.  Nmeros de bper  - Dr. Kowalski: [phone]  - Dra. Moye: [phone]  - Dra. Stewart: [phone]  En caso de inclemencias del tiempo, por favor llame a nuestra lnea principal al [phone] para una actualizacin sobre el estado de cualquier retraso o cierre.  Consejos para la medicacin en dermatologa: Por favor, guarde las cajas en las que vienen los medicamentos de uso tpico para ayudarle a seguir las instrucciones sobre dnde y cmo usarlos. Las farmacias generalmente imprimen las instrucciones del medicamento slo en las cajas y no directamente en los tubos del medicamento.   Si su medicamento es muy caro, por favor, pngase en contacto con nuestra oficina llamando al [phone] y presione la opcin 4 o envenos un mensaje a travs de MyChart.   No podemos decirle cul ser su copago por los medicamentos por adelantado ya que esto es diferente dependiendo de la cobertura de su seguro.  Sin embargo, es posible que podamos encontrar un medicamento sustituto a menor costo o llenar un formulario para que el seguro cubra el medicamento que se considera necesario.   Si se requiere una autorizacin previa para que su compaa de seguros cubra su medicamento, por favor permtanos de 1 a 2 das hbiles para completar este proceso.  Los precios de los medicamentos varan con frecuencia dependiendo del lugar de dnde se surte la receta y alguna farmacias pueden ofrecer precios ms baratos.  El sitio web www.goodrx.com tiene cupones para medicamentos de diferentes farmacias. Los precios aqu no tienen en cuenta lo que podra costar con la ayuda del seguro (puede ser ms barato con su seguro), pero el sitio web puede darle el precio si no utiliz ningn seguro.  - Puede imprimir el cupn correspondiente y llevarlo con su receta a la farmacia.  - Tambin puede pasar por nuestra oficina durante el horario de atencin regular y recoger una tarjeta de cupones de GoodRx.  -   Si necesita que su receta se enve electrnicamente a una farmacia diferente, informe a nuestra oficina a travs de MyChart de [facility] o por telfono llamando al [phone] y presione la opcin 4.  

## 2022-08-13 NOTE — Progress Notes (Signed)
Follow-Up Visit   Subjective  Justin Torres is a 77 y.o. male who presents for the following: Skin Cancer Screening and Upper Body Skin Exam, hx of Aks, check itchy spot back ~17m, Irritated spot L nasolabial hits with shaving  The patient presents for Upper Body Skin Exam (UBSE) for skin cancer screening and mole check. The patient has spots, moles and lesions to be evaluated, some may be new or changing and the patient has concerns that these could be cancer.    The following portions of the chart were reviewed this encounter and updated as appropriate: medications, allergies, medical history  Review of Systems:  No other skin or systemic complaints except as noted in HPI or Assessment and Plan.  Objective  Well appearing patient in no apparent distress; mood and affect are within normal limits.  All skin waist up examined. Relevant physical exam findings are noted in the Assessment and Plan.  Back x 2, L upper lip x 1 (3) Stuck on waxy paps with erythema  face x 8 (8) Pink scaly macules    Assessment & Plan   Inflamed seborrheic keratosis (3) Back x 2, L upper lip x 1  Symptomatic, irritating, patient would like treated.   L upper lip ISK vs Wart, if not resolved in ~6wks rtc    Destruction of lesion - Back x 2, L upper lip x 1 Complexity: simple   Destruction method: cryotherapy   Informed consent: discussed and consent obtained   Timeout:  patient name, date of birth, surgical site, and procedure verified Lesion destroyed using liquid nitrogen: Yes   Region frozen until ice ball extended beyond lesion: Yes   Outcome: patient tolerated procedure well with no complications   Post-procedure details: wound care instructions given    AK (actinic keratosis) (8) face x 8  Destruction of lesion - face x 8 Complexity: simple   Destruction method: cryotherapy   Informed consent: discussed and consent obtained   Timeout:  patient name, date of birth, surgical  site, and procedure verified Lesion destroyed using liquid nitrogen: Yes   Region frozen until ice ball extended beyond lesion: Yes   Outcome: patient tolerated procedure well with no complications   Post-procedure details: wound care instructions given     Lentigines, Seborrheic Keratoses, Hemangiomas - Benign normal skin lesions - Benign-appearing - Call for any changes  Melanocytic Nevi - Tan-brown and/or pink-flesh-colored symmetric macules and papules - Benign appearing on exam today - Observation - Call clinic for new or changing moles - Recommend daily use of broad spectrum spf 30+ sunscreen to sun-exposed areas.   Actinic Damage - Chronic condition, secondary to cumulative UV/sun exposure - diffuse scaly erythematous macules with underlying dyspigmentation - Recommend daily broad spectrum sunscreen SPF 30+ to sun-exposed areas, reapply every 2 hours as needed.  - Staying in the shade or wearing long sleeves, sun glasses (UVA+UVB protection) and wide brim hats (4-inch brim around the entire circumference of the hat) are also recommended for sun protection.  - Call for new or changing lesions.  Skin cancer screening performed today.  EPIDERMAL INCLUSION CYST Exam: Subcutaneous nodule at R forehead 0.3cm  Benign-appearing. Exam most consistent with an epidermal inclusion cyst. Discussed that a cyst is a benign growth that can grow over time and sometimes get irritated or inflamed. Recommend observation if it is not bothersome. Discussed option of surgical excision to remove it if it is growing, symptomatic, or other changes noted. Please call for new  or changing lesions so they can be evaluated.     Return in about 1 year (around 08/13/2023) for UBSE, Hx of AKs.  I, Ardis Rowan, RMA, am acting as scribe for Armida Sans, MD .   Documentation: I have reviewed the above documentation for accuracy and completeness, and I agree with the above.  Armida Sans, MD

## 2022-08-15 ENCOUNTER — Encounter: Payer: Self-pay | Admitting: Dermatology

## 2022-10-28 ENCOUNTER — Other Ambulatory Visit: Payer: Self-pay | Admitting: Internal Medicine

## 2022-10-28 DIAGNOSIS — R7989 Other specified abnormal findings of blood chemistry: Secondary | ICD-10-CM

## 2022-10-30 ENCOUNTER — Ambulatory Visit
Admission: RE | Admit: 2022-10-30 | Discharge: 2022-10-30 | Disposition: A | Discharge status: Home / Self Care | Payer: Medicare Other | Source: Ambulatory Visit | Attending: Internal Medicine | Admitting: Internal Medicine

## 2022-10-30 DIAGNOSIS — R7989 Other specified abnormal findings of blood chemistry: Secondary | ICD-10-CM

## 2023-02-03 ENCOUNTER — Encounter: Payer: Self-pay | Admitting: Urology

## 2023-05-13 ENCOUNTER — Ambulatory Visit: Payer: Medicare Other | Admitting: Urology

## 2023-05-14 ENCOUNTER — Ambulatory Visit (INDEPENDENT_AMBULATORY_CARE_PROVIDER_SITE_OTHER): Payer: Medicare Other | Admitting: Urology

## 2023-05-14 ENCOUNTER — Encounter: Payer: Self-pay | Admitting: Urology

## 2023-05-14 VITALS — BP 152/76 | HR 65 | Ht 68.0 in | Wt 197.0 lb

## 2023-05-14 DIAGNOSIS — N401 Enlarged prostate with lower urinary tract symptoms: Secondary | ICD-10-CM | POA: Diagnosis not present

## 2023-05-14 DIAGNOSIS — N138 Other obstructive and reflux uropathy: Secondary | ICD-10-CM | POA: Diagnosis not present

## 2023-05-14 DIAGNOSIS — Z125 Encounter for screening for malignant neoplasm of prostate: Secondary | ICD-10-CM | POA: Diagnosis not present

## 2023-05-14 LAB — BLADDER SCAN AMB NON-IMAGING: Scan Result: 0

## 2023-05-14 MED ORDER — DOXAZOSIN MESYLATE 4 MG PO TABS: 4.0000 mg | ORAL_TABLET | Freq: Every day | ORAL | 3 refills | Status: AC

## 2023-05-14 MED ORDER — FINASTERIDE 5 MG PO TABS: 5.0000 mg | ORAL_TABLET | Freq: Every day | ORAL | 3 refills | Status: AC

## 2023-05-14 NOTE — Progress Notes (Signed)
   05/14/2023 9:09 AM   Justin Torres 02-19-1945 [id number]  Reason for visit: Follow up BPH and urinary symptoms, PSA screening  HPI: 78 year old male we have followed for the above issues-remains on doxazosin and finasteride daily with good control of his urinary symptoms.  PVRs have been normal including 0ml today.  Primary issue is urinary frequency and occasional urgency during the day, but not bothersome enough to consider other medications or surgical options.  Stable nocturia 1-2 times overnight.  His PSA continues to be checked by PCP, we have previously reviewed risks and benefits of screening extensively and he has opted to continue PSA monitoring.  He had a negative prostate biopsy in 2018, he did not tolerate biopsy well at that time, and prostate measured 96 g on CT in 2020.  PSA had increased up to 10.4 in September 2022, but decreased appropriately on finasteride, most recently 2.0 from September 2024.  Reassurance provided regarding normal PSA, especially in the setting of known significant BPH with enlarged prostate.  Continue finasteride and doxazosin, refilled RTC 1 year PVR Consider cystoscopy/TRUS in the future if worsening urinary symptoms  Sondra Come, MD  [facility] [address] [place], [place] [address] [phone]

## 2023-05-14 NOTE — Patient Instructions (Signed)

## 2023-05-21 ENCOUNTER — Other Ambulatory Visit
Admission: RE | Admit: 2023-05-21 | Discharge: 2023-05-21 | Disposition: A | Discharge status: Home / Self Care | Source: Ambulatory Visit | Attending: Internal Medicine | Admitting: Internal Medicine

## 2023-05-21 DIAGNOSIS — R0609 Other forms of dyspnea: Secondary | ICD-10-CM | POA: Insufficient documentation

## 2023-05-21 LAB — TROPONIN I (HIGH SENSITIVITY): Troponin I (High Sensitivity): 6 ng/L (ref <18)

## 2023-07-16 ENCOUNTER — Encounter
Admission: RE | Disposition: A | Discharge status: Home / Self Care | Payer: Self-pay | Source: Ambulatory Visit | Attending: Cardiology

## 2023-07-16 ENCOUNTER — Encounter: Payer: Self-pay | Admitting: Cardiology

## 2023-07-16 ENCOUNTER — Other Ambulatory Visit: Payer: Self-pay

## 2023-07-16 ENCOUNTER — Ambulatory Visit
Admission: RE | Admit: 2023-07-16 | Discharge: 2023-07-16 | Disposition: A | Discharge status: Home / Self Care | Source: Ambulatory Visit | Attending: Cardiology | Admitting: Cardiology

## 2023-07-16 DIAGNOSIS — R9430 Abnormal result of cardiovascular function study, unspecified: Secondary | ICD-10-CM | POA: Diagnosis present

## 2023-07-16 DIAGNOSIS — I2510 Atherosclerotic heart disease of native coronary artery without angina pectoris: Secondary | ICD-10-CM | POA: Diagnosis not present

## 2023-07-16 DIAGNOSIS — I2584 Coronary atherosclerosis due to calcified coronary lesion: Secondary | ICD-10-CM | POA: Diagnosis not present

## 2023-07-16 HISTORY — PX: LEFT HEART CATH AND CORONARY ANGIOGRAPHY: CATH118249

## 2023-07-16 SURGERY — LEFT HEART CATH AND CORONARY ANGIOGRAPHY
Anesthesia: Moderate Sedation | Laterality: Left

## 2023-07-16 MED ORDER — HEPARIN (PORCINE) IN NACL 1000-0.9 UT/500ML-% IV SOLN
INTRAVENOUS | Status: AC
  Filled 2023-07-16: qty 1000

## 2023-07-16 MED ORDER — HEPARIN SODIUM (PORCINE) 1000 UNIT/ML IJ SOLN
INTRAMUSCULAR | Status: DC | PRN
  Administered 2023-07-16: 4000 [IU] via INTRAVENOUS

## 2023-07-16 MED ORDER — FENTANYL CITRATE (PF) 100 MCG/2ML IJ SOLN
INTRAMUSCULAR | Status: DC | PRN
  Administered 2023-07-16: 25 ug via INTRAVENOUS

## 2023-07-16 MED ORDER — VERAPAMIL HCL 2.5 MG/ML IV SOLN
INTRAVENOUS | Status: DC | PRN
  Administered 2023-07-16: 2.5 mg via INTRAVENOUS

## 2023-07-16 MED ORDER — MIDAZOLAM HCL 2 MG/2ML IJ SOLN
INTRAMUSCULAR | Status: AC
  Filled 2023-07-16: qty 2

## 2023-07-16 MED ORDER — HEPARIN (PORCINE) IN NACL 1000-0.9 UT/500ML-% IV SOLN
INTRAVENOUS | Status: DC | PRN
  Administered 2023-07-16: 1000 mL

## 2023-07-16 MED ORDER — SODIUM CHLORIDE 0.9 % WEIGHT BASED INFUSION: 1.0000 mL/kg/h | INTRAVENOUS | Status: DC

## 2023-07-16 MED ORDER — MIDAZOLAM HCL 2 MG/2ML IJ SOLN
INTRAMUSCULAR | Status: DC | PRN
  Administered 2023-07-16: 1 mg via INTRAVENOUS

## 2023-07-16 MED ORDER — SODIUM CHLORIDE 0.9 % IV SOLN: 250.0000 mL | INTRAVENOUS | Status: DC | PRN

## 2023-07-16 MED ORDER — SODIUM CHLORIDE 0.9% FLUSH
3.0000 mL | Freq: Two times a day (BID) | INTRAVENOUS | Status: DC
Start: 2023-07-16 — End: 2023-07-16

## 2023-07-16 MED ORDER — HEPARIN SODIUM (PORCINE) 1000 UNIT/ML IJ SOLN
INTRAMUSCULAR | Status: AC
Start: 2023-07-16 — End: ?
  Filled 2023-07-16: qty 10

## 2023-07-16 MED ORDER — SODIUM CHLORIDE 0.9% FLUSH
3.0000 mL | INTRAVENOUS | Status: DC | PRN
Start: 2023-07-16 — End: 2023-07-16

## 2023-07-16 MED ORDER — SODIUM CHLORIDE 0.9% FLUSH: 3.0000 mL | INTRAVENOUS | Status: DC | PRN

## 2023-07-16 MED ORDER — ASPIRIN 81 MG PO CHEW
CHEWABLE_TABLET | ORAL | Status: AC
Start: 2023-07-16 — End: ?
  Filled 2023-07-16: qty 1

## 2023-07-16 MED ORDER — ONDANSETRON HCL 4 MG/2ML IJ SOLN: 4.0000 mg | Freq: Four times a day (QID) | INTRAMUSCULAR | Status: DC | PRN

## 2023-07-16 MED ORDER — LABETALOL HCL 5 MG/ML IV SOLN: 10.0000 mg | INTRAVENOUS | Status: DC | PRN

## 2023-07-16 MED ORDER — ACETAMINOPHEN 325 MG PO TABS: 650.0000 mg | ORAL_TABLET | ORAL | Status: DC | PRN

## 2023-07-16 MED ORDER — LIDOCAINE HCL 1 % IJ SOLN
INTRAMUSCULAR | Status: AC
  Filled 2023-07-16: qty 20

## 2023-07-16 MED ORDER — SODIUM CHLORIDE 0.9 % WEIGHT BASED INFUSION
3.0000 mL/kg/h | INTRAVENOUS | Status: AC
  Administered 2023-07-16: 3 mL/kg/h via INTRAVENOUS

## 2023-07-16 MED ORDER — LIDOCAINE HCL (PF) 1 % IJ SOLN
INTRAMUSCULAR | Status: DC | PRN
  Administered 2023-07-16: 2 mL

## 2023-07-16 MED ORDER — ASPIRIN 81 MG PO CHEW
81.0000 mg | CHEWABLE_TABLET | ORAL | Status: AC
  Administered 2023-07-16: 81 mg via ORAL

## 2023-07-16 MED ORDER — FENTANYL CITRATE (PF) 100 MCG/2ML IJ SOLN
INTRAMUSCULAR | Status: AC
Start: 2023-07-16 — End: ?
  Filled 2023-07-16: qty 2

## 2023-07-16 MED ORDER — SODIUM CHLORIDE 0.9% FLUSH: 3.0000 mL | Freq: Two times a day (BID) | INTRAVENOUS | Status: DC

## 2023-07-16 MED ORDER — SODIUM CHLORIDE 0.9 % WEIGHT BASED INFUSION
1.0000 mL/kg/h | INTRAVENOUS | Status: DC
Start: 2023-07-16 — End: 2023-07-16

## 2023-07-16 MED ORDER — VERAPAMIL HCL 2.5 MG/ML IV SOLN
INTRAVENOUS | Status: AC
  Filled 2023-07-16: qty 2

## 2023-07-16 MED ORDER — SODIUM CHLORIDE 0.9 % IV SOLN
250.0000 mL | INTRAVENOUS | Status: DC | PRN
Start: 2023-07-16 — End: 2023-07-16

## 2023-07-16 MED ORDER — HYDRALAZINE HCL 20 MG/ML IJ SOLN: 10.0000 mg | INTRAMUSCULAR | Status: DC | PRN

## 2023-07-16 MED ORDER — IOHEXOL 300 MG/ML  SOLN
INTRAMUSCULAR | Status: DC | PRN
  Administered 2023-07-16: 90 mL

## 2023-07-16 SURGICAL SUPPLY — 10 items
CATH 5FR JL3.5 JR4 ANG PIG MP (CATHETERS) IMPLANT
CATH INFINITI 5 FR 3DRC (CATHETERS) IMPLANT
DEVICE RAD TR BAND REGULAR (VASCULAR PRODUCTS) IMPLANT
DRAPE BRACHIAL (DRAPES) IMPLANT
GLIDESHEATH SLEND SS 6F .021 (SHEATH) IMPLANT
GUIDEWIRE INQWIRE 1.5J.035X260 (WIRE) IMPLANT
PACK CARDIAC CATH (CUSTOM PROCEDURE TRAY) ×1 IMPLANT
SET ATX-X65L (MISCELLANEOUS) IMPLANT
STATION PROTECTION PRESSURIZED (MISCELLANEOUS) IMPLANT
WIRE HITORQ VERSACORE ST 145CM (WIRE) IMPLANT

## 2023-07-16 NOTE — Discharge Instructions (Signed)
 Radial Site Care Refer to this sheet in the next few weeks. These instructions provide you with information about caring for yourself after your procedure. Your health care provider may also give you more specific instructions. Your treatment has been planned according to current medical practices, but problems sometimes occur. Call your health care provider if you have any problems or questions after your procedure. What can I expect after the procedure? After your procedure, it is typical to have the following: Bruising at the radial site that usually fades within 1-2 weeks. Blood collecting in the tissue (hematoma) that may be painful to the touch. It should usually decrease in size and tenderness within 1-2 weeks.  Follow these instructions at home: Take medicines only as directed by your health care provider. If you are on a medication called Metformin please do not take for 48 hours after your procedure. Over the next 48hrs please increase your fluid intake of water and non caffeine beverages to flush the contrast dye out of your system.  You may shower 24 hours after the procedure  Leave your bandage on and gently wash the site with plain soap and water. Pat the area dry with a clean towel. Do not rub the site, because this may cause bleeding.  Remove your dressing 48hrs after your procedure and leave open to air.  Do not submerge your site in water for 7 days. This includes swimming and washing dishes.  Check your insertion site every day for redness, swelling, or drainage. Do not apply powder or lotion to the site. Do not flex or bend the affected arm for 24 hours or as directed by your health care provider. Do not push or pull heavy objects with the affected arm for 24 hours or as directed by your health care provider. Do not lift over 10 lb (4.5 kg) for 5 days after your procedure or as directed by your health care provider. Ask your health care provider when it is okay to: Return to  work or school. Resume usual physical activities or sports. Resume sexual activity. Do not drive home if you are discharged the same day as the procedure. Have someone else drive you. You may drive 48 hours after the procedure Do not operate machinery or power tools for 24 hours after the procedure. If your procedure was done as an outpatient procedure, which means that you went home the same day as your procedure, a responsible adult should be with you for the first 24 hours after you arrive home. Keep all follow-up visits as directed by your health care provider. This is important. Contact a health care provider if: You have a fever. You have chills. You have increased bleeding from the radial site. Hold pressure on the site. Get help right away if: You have unusual pain at the radial site. You have redness, warmth, or swelling at the radial site. You have drainage (other than a small amount of blood on the dressing) from the radial site. The radial site is bleeding, and the bleeding does not stop after 15 minutes of holding steady pressure on the site. Your arm or hand becomes pale, cool, tingly, or numb. This information is not intended to replace advice given to you by your health care provider. Make sure you discuss any questions you have with your health care provider. Document Released: 03/08/2010 Document Revised: 07/12/2015 Document Reviewed: 08/22/2013 Elsevier Interactive Patient Education  2018 [employer].

## 2023-08-05 ENCOUNTER — Ambulatory Visit: Payer: Self-pay

## 2023-08-05 NOTE — Telephone Encounter (Signed)
 Copied from CRM [id number]. Topic: Clinical - Red Word Triage >> Aug 05, 2023  2:24 PM Donald Frost wrote: Red Word that prompted transfer to Nurse Triage: The patient called in stating he had 2 stents put in last week at [facility] and he had blood built up under his arm so they kept him. He has been experiencing tingling in a few fingers and the pain wakes him up occasionally where he has to get up and walk around and take a pain pill. I will transfer him to E2C2 NT    Reason for Disposition  [1] MODERATE pain (e.g., interferes with normal activities) AND [2] present > 3 days  Answer Assessment - Initial Assessment Questions 1. ONSET: When did the pain start?     1 week ago  2. LOCATION: Where is the pain located?     Left arm where they went in for stent placement  3. PAIN: How bad is the pain? (Scale 1-10; or mild, moderate, severe)   - MILD (1-3): Doesn't interfere with normal activities.   - MODERATE (4-7): Interferes with normal activities (e.g., work or school) or awakens from sleep.   - SEVERE (8-10): Excruciating pain, unable to do any normal activities, unable to hold a cup of water.     Mild to moderate  4. WORK OR EXERCISE: Has there been any recent work or exercise that involved this part of the body?     No 5. CAUSE: What do you think is causing the arm pain?     Stent placement last week 6. OTHER SYMPTOMS: Do you have any other symptoms? (e.g., neck pain, swelling, rash, fever, numbness, weakness)     Tingling in some fingers   Patient advised to call cardiologist who completed his surgery to inform them of his symptoms. Patient understood and is agreeable with this plan.  Protocols used: Arm Pain-A-AH

## 2023-08-26 ENCOUNTER — Ambulatory Visit: Payer: Medicare Other | Admitting: Dermatology

## 2023-09-03 ENCOUNTER — Ambulatory Visit: Admitting: Dermatology

## 2023-09-03 DIAGNOSIS — D229 Melanocytic nevi, unspecified: Secondary | ICD-10-CM

## 2023-09-03 DIAGNOSIS — L720 Epidermal cyst: Secondary | ICD-10-CM

## 2023-09-03 DIAGNOSIS — L820 Inflamed seborrheic keratosis: Secondary | ICD-10-CM | POA: Diagnosis not present

## 2023-09-03 DIAGNOSIS — L729 Follicular cyst of the skin and subcutaneous tissue, unspecified: Secondary | ICD-10-CM

## 2023-09-03 DIAGNOSIS — L821 Other seborrheic keratosis: Secondary | ICD-10-CM

## 2023-09-03 DIAGNOSIS — Z1283 Encounter for screening for malignant neoplasm of skin: Secondary | ICD-10-CM

## 2023-09-03 DIAGNOSIS — L578 Other skin changes due to chronic exposure to nonionizing radiation: Secondary | ICD-10-CM

## 2023-09-03 DIAGNOSIS — L570 Actinic keratosis: Secondary | ICD-10-CM | POA: Diagnosis not present

## 2023-09-03 DIAGNOSIS — W908XXA Exposure to other nonionizing radiation, initial encounter: Secondary | ICD-10-CM

## 2023-09-03 DIAGNOSIS — L814 Other melanin hyperpigmentation: Secondary | ICD-10-CM

## 2023-09-03 NOTE — Patient Instructions (Addendum)

## 2023-09-03 NOTE — Progress Notes (Signed)
 Follow-Up Visit   Subjective  Justin Torres is a 78 y.o. male who presents for the following: Skin Cancer Screening and Full Body Skin Exam Hx of aks, isks,  Reports itchy spot on right arm and left side  The patient presents for Total-Body Skin Exam (TBSE) for skin cancer screening and mole check. The patient has spots, moles and lesions to be evaluated, some may be new or changing and the patient may have concern these could be cancer.    The following portions of the chart were reviewed this encounter and updated as appropriate: medications, allergies, medical history  Review of Systems:  No other skin or systemic complaints except as noted in HPI or Assessment and Plan.  Objective  Well appearing patient in no apparent distress; mood and affect are within normal limits.  A full examination was performed including scalp, head, eyes, ears, nose, lips, neck, chest, axillae, abdomen, back, buttocks, bilateral upper extremities, bilateral lower extremities, hands, feet, fingers, toes, fingernails, and toenails. All findings within normal limits unless otherwise noted below.   Relevant physical exam findings are noted in the Assessment and Plan.  right arm x 1 , left side x 1, left temple x 1 (3) Erythematous stuck-on, waxy papule or plaque face x 8 (8) Erythematous thin papules/macules with gritty scale.   Assessment & Plan   SKIN CANCER SCREENING PERFORMED TODAY.  ACTINIC DAMAGE - Chronic condition, secondary to cumulative UV/sun exposure - diffuse scaly erythematous macules with underlying dyspigmentation - Recommend daily broad spectrum sunscreen SPF 30+ to sun-exposed areas, reapply every 2 hours as needed.  - Staying in the shade or wearing long sleeves, sun glasses (UVA+UVB protection) and wide brim hats (4-inch brim around the entire circumference of the hat) are also recommended for sun protection.  - Call for new or changing lesions.  LENTIGINES, SEBORRHEIC  KERATOSES, HEMANGIOMAS - Benign normal skin lesions - Benign-appearing - Call for any changes  MELANOCYTIC NEVI - Tan-brown and/or pink-flesh-colored symmetric macules and papules - Benign appearing on exam today - Observation - Call clinic for new or changing moles - Recommend daily use of broad spectrum spf 30+ sunscreen to sun-exposed areas.   EPIDERMAL INCLUSION CYST Exam: Subcutaneous nodule at R forehead 0.3cm   Benign-appearing. Exam most consistent with an epidermal inclusion cyst. Discussed that a cyst is a benign growth that can grow over time and sometimes get irritated or inflamed. Recommend observation if it is not bothersome. Discussed option of surgical excision to remove it if it is growing, symptomatic, or other changes noted. Please call for new or changing lesions so they can be evaluated.  Varicose Veins/Spider Veins - Dilated blue, purple or red veins at the lower extremities - Reassured - Smaller vessels can be treated by sclerotherapy (a procedure to inject a medicine into the veins to make them disappear) if desired, but the treatment is not covered by insurance. Larger vessels may be covered if symptomatic and we would refer to vascular surgeon if treatment desired.    INFLAMED SEBORRHEIC KERATOSIS (3) right arm x 1 , left side x 1, left temple x 1 (3) Symptomatic, irritating, patient would like treated. Destruction of lesion - right arm x 1 , left side x 1, left temple x 1 (3) Complexity: simple   Destruction method: cryotherapy   Informed consent: discussed and consent obtained   Timeout:  patient name, date of birth, surgical site, and procedure verified Lesion destroyed using liquid nitrogen: Yes   Region  frozen until ice ball extended beyond lesion: Yes   Outcome: patient tolerated procedure well with no complications   Post-procedure details: wound care instructions given    ACTINIC KERATOSIS (8) face x 8 (8) Actinic keratoses are precancerous  spots that appear secondary to cumulative UV radiation exposure/sun exposure over time. They are chronic with expected duration over 1 year. A portion of actinic keratoses will progress to squamous cell carcinoma of the skin. It is not possible to reliably predict which spots will progress to skin cancer and so treatment is recommended to prevent development of skin cancer.  Recommend daily broad spectrum sunscreen SPF 30+ to sun-exposed areas, reapply every 2 hours as needed.  Recommend staying in the shade or wearing long sleeves, sun glasses (UVA+UVB protection) and wide brim hats (4-inch brim around the entire circumference of the hat). Call for new or changing lesions. Destruction of lesion - face x 8 (8) Complexity: simple   Destruction method: cryotherapy   Informed consent: discussed and consent obtained   Timeout:  patient name, date of birth, surgical site, and procedure verified Lesion destroyed using liquid nitrogen: Yes   Region frozen until ice ball extended beyond lesion: Yes   Outcome: patient tolerated procedure well with no complications   Post-procedure details: wound care instructions given    Return in about 1 year (around 09/02/2024) for TBSE.  IEleanor Blush, CMA, am acting as scribe for Alm Rhyme, MD.   Documentation: I have reviewed the above documentation for accuracy and completeness, and I agree with the above.  Alm Rhyme, MD

## 2023-09-08 ENCOUNTER — Encounter: Payer: Self-pay | Admitting: Dermatology

## 2023-10-15 ENCOUNTER — Encounter: Payer: Self-pay | Admitting: Urology

## 2024-05-18 ENCOUNTER — Ambulatory Visit: Admitting: Urology

## 2024-05-19 ENCOUNTER — Ambulatory Visit: Admitting: Urology

## 2024-09-08 ENCOUNTER — Ambulatory Visit: Admitting: Dermatology
